# Patient Record
Sex: Female | Born: 1955 | Race: White | Hispanic: No | Marital: Married | Smoking: Never smoker
Health system: Southern US, Community
[De-identification: ages and names within clinical notes are randomized; demographics above are authoritative.]

## PROBLEM LIST (undated history)

## (undated) DIAGNOSIS — Z973 Presence of spectacles and contact lenses: Secondary | ICD-10-CM

## (undated) DIAGNOSIS — H409 Unspecified glaucoma: Secondary | ICD-10-CM

## (undated) DIAGNOSIS — Z8711 Personal history of peptic ulcer disease: Secondary | ICD-10-CM

## (undated) DIAGNOSIS — F32A Depression, unspecified: Secondary | ICD-10-CM

## (undated) DIAGNOSIS — J309 Allergic rhinitis, unspecified: Secondary | ICD-10-CM

## (undated) DIAGNOSIS — R1900 Intra-abdominal and pelvic swelling, mass and lump, unspecified site: Secondary | ICD-10-CM

## (undated) DIAGNOSIS — M1990 Unspecified osteoarthritis, unspecified site: Secondary | ICD-10-CM

## (undated) DIAGNOSIS — F329 Major depressive disorder, single episode, unspecified: Secondary | ICD-10-CM

## (undated) HISTORY — PX: CARPAL TUNNEL RELEASE: SHX101

## (undated) HISTORY — PX: HERNIA REPAIR: SHX51

## (undated) HISTORY — PX: EYE SURGERY: SHX253

## (undated) HISTORY — PX: OTHER SURGICAL HISTORY: SHX169

---

## 2001-11-07 ENCOUNTER — Encounter: Payer: Self-pay | Admitting: Obstetrics and Gynecology

## 2001-11-07 ENCOUNTER — Ambulatory Visit (HOSPITAL_COMMUNITY): Admission: RE | Admit: 2001-11-07 | Discharge: 2001-11-07 | Payer: Self-pay | Admitting: Obstetrics and Gynecology

## 2002-02-12 ENCOUNTER — Inpatient Hospital Stay (HOSPITAL_COMMUNITY): Admission: RE | Admit: 2002-02-12 | Discharge: 2002-02-14 | Payer: Self-pay | Admitting: Obstetrics and Gynecology

## 2002-02-12 HISTORY — PX: VAGINAL HYSTERECTOMY: SHX2639

## 2003-02-19 ENCOUNTER — Ambulatory Visit (HOSPITAL_COMMUNITY): Admission: RE | Admit: 2003-02-19 | Discharge: 2003-02-19 | Payer: Self-pay | Admitting: Obstetrics and Gynecology

## 2003-02-19 ENCOUNTER — Encounter: Payer: Self-pay | Admitting: Obstetrics and Gynecology

## 2003-12-26 ENCOUNTER — Encounter: Admission: RE | Admit: 2003-12-26 | Discharge: 2003-12-26 | Payer: Self-pay | Admitting: Family Medicine

## 2004-03-05 ENCOUNTER — Ambulatory Visit (HOSPITAL_COMMUNITY): Admission: RE | Admit: 2004-03-05 | Discharge: 2004-03-05 | Payer: Self-pay | Admitting: Obstetrics and Gynecology

## 2005-07-02 ENCOUNTER — Ambulatory Visit (HOSPITAL_COMMUNITY): Admission: RE | Admit: 2005-07-02 | Discharge: 2005-07-02 | Payer: Self-pay | Admitting: Obstetrics and Gynecology

## 2005-08-02 HISTORY — PX: CARPAL TUNNEL RELEASE: SHX101

## 2006-04-28 ENCOUNTER — Ambulatory Visit (HOSPITAL_COMMUNITY): Admission: RE | Admit: 2006-04-28 | Discharge: 2006-04-29 | Payer: Self-pay | Admitting: Urology

## 2006-04-28 HISTORY — PX: CYSTOCELE REPAIR: SHX163

## 2007-08-08 ENCOUNTER — Ambulatory Visit (HOSPITAL_COMMUNITY): Admission: RE | Admit: 2007-08-08 | Discharge: 2007-08-08 | Payer: Self-pay | Admitting: Family Medicine

## 2009-06-10 ENCOUNTER — Ambulatory Visit (HOSPITAL_COMMUNITY): Admission: RE | Admit: 2009-06-10 | Discharge: 2009-06-10 | Payer: Self-pay | Admitting: Family Medicine

## 2010-12-18 NOTE — Op Note (Signed)
NAME:  [name], [name]                  ACCOUNT NO.:  [id number]   MEDICAL RECORD NO.:  [id number]          PATIENT TYPE:  AMB   LOCATION:  DAY                          FACILITY:  [facility]   PHYSICIAN:  Excell Seltzer. Annabell Howells, M.D.    DATE OF BIRTH:  02/04/1956   DATE OF PROCEDURE:  04/28/2006  DATE OF DISCHARGE:                                 OPERATIVE REPORT   PROCEDURE:  Transvaginal paravaginal cystocele repair with graft.   PREOPERATIVE DIAGNOSIS:  Recurrent cystocele.   POSTOPERATIVE DIAGNOSIS:  Recurrent cystocele.   SURGEON:  Excell Seltzer. Annabell Howells, M.D.   ASSISTANT:  Martina Sinner, MD.   ANESTHESIA:  General.   SPECIMEN:  None.   BLOOD LOSS:  100 cc.   DRAINS:  Foley catheter and vaginal pack.   COMPLICATIONS:  None.   INDICATIONS:  [name] is a 55 year old white female with history of  anterior-posterior paravaginal hysterectomy and tension-free vaginal taping  in July 2003.  She now has a recurrent cystocele that protrudes the  introitus.  She has no rectocele; her vault is reasonably well-supported at  the apex and she does have some stress incontinence with urgency -- only  with a full bladder.  Urodynamics revealed a hypotonic bladder.  It was felt  that cystocele repair only was indicated.   FINDINGS AND PROCEDURE:  The patient was given Cipro.  She was taken to the  operating room, where general anesthetic was induced.  She was placed in the  lithotomy position.  She was shaved.  She was prepped with Betadine solution  and draped in the usual sterile fashion.  A Foley catheter was inserted.  The bladder was drained.  A weighted vaginal retractor was placed.  The  anterior vaginal wall was infiltrated  with 20 cc of 1% lidocaine with  epinephrine.  An anterior vaginal incision was made from the bladder neck  level back to her vaginal cuff.  The vaginal mucosa was then elevated off  the pubovesical fascia, out to the pelvic sidewall on both sides.  At this  point the  cystocele was reduced, using a running 2-0 Vicryl suture in 2  layers.   Cystoscopy was then performed, after the patient had been given indigo  carmine.  Blue urine was noted.  The efflux from a single left ureteral  orifice and duplicate right ureteral orifices.   The Foley catheter was reinserted; the bladder was drained.  A Xenform 6 x  10 cm graft was prepared and brought onto the field.  then 0 Ethibond  sutures were placed at the proximal limb of the cystocele, into the pelvic  sidewall bilaterally, and at the distal limit of the cystocele on into the  pelvic sidewall.  Once these stitches were positioned, the graft was trimmed  to an appropriate size and secured using the preplaced Ethibond stitches.  Once the graft had been sewn down, the graft was tacked at the cuff  using a  3-0 Vicryl, and at the bladder neck using 3-0 Vicryl.  The anterior vaginal  wall mucosa was then trimmed  appropriately and closed using a running 2-0  Vicryl suture.  A 2 inch vaginal pack was then placed.  The Foley was placed  to straight drainage.  The patient was taken down from the lithotomy  position.  Her anesthetic was reversed.  She was moved to the recovery room  in stable condition.  There were no complications.      Excell Seltzer. Annabell Howells, M.D.  Electronically Signed     JJW/MEDQ  D:  04/28/2006  T:  04/30/2006  Job:  [id number]   cc:   Duwayne Heck L. Mahaffey, M.D.  Fax: [phone]

## 2010-12-18 NOTE — Op Note (Signed)
[facility]  Patient:    [name], [name] Visit Number: [id number] MRN: [id number]          Service Type: MED Location: 4A A416 01 Attending Physician:  Tilda Burrow Dictated by:   Dennie Maizes, M.D. Proc. Date: 02/12/02 Admit Date:  02/12/2002   CC:         Christin Bach, M.D.   Operative Report  PREOPERATIVE DIAGNOSIS:  Uterine prolapse, pelvic relaxation, stress urinary incontinence, umbilical hernia.  POSTOPERATIVE DIAGNOSIS:  Uterine prolapse, pelvic relaxation, stress urinary incontinence, umbilical hernia.  OPERATIVE PROCEDURE:  Tension-free transvaginal ______ urethral sling procedure using the Uretex system.  ANESTHESIA:  General.  SURGEON:  Dennie Maizes, M.D.  ASSISTANT:  Christin Bach, M.D.  ESTIMATED BLOOD LOSS:  Minimal.  COMPLICATIONS:  None.  DRAINS:  A 20 French Foley catheter in the bladder.  INDICATIONS FOR PROCEDURE:  This 55 year old female had troublesome stress urinary incontinence associated with pelvic relaxation and umbilical hernia. She is scheduled to undergo vaginal hysterectomy, anterior and posterior repair, and umbilical hernia repair by Dr. Emelda Fear.  I plan to do tension-free transvaginal ______ procedure under the same anesthesia.  DESCRIPTION OF PROCEDURE:  General anesthesia was induced and the patient was placed on the OR table in the high lithotomy position.  Dr. Emelda Fear first did the umbilical hernia repair.  The abdomen and genitalia were reprepped and draped in a sterile fashion.  He proceeded with a vaginal hysterectomy and mobilized the bladder.  Anterior ______ done next.  I then proceeded with TVT procedure.  A 20 French Foley catheter was insufflated into the bladder and clear urine was drained.  The pubic tubercles as well as the two lateral points, about 1.5 cm above and lateral to the pubic tubercles were marked on the skin.  About 10 cc of 0.25% Marcaine was injected along the side  of the bladder for hydrodissection.  The urethra was then held with two Allis forceps.  A 1 cm sized incision was then made in the mid urethra.  Mucosal flaps were raised on both sides along the side of the urethra.  The trocar carrying the blue guide tube was inserted on the right side with digital guidance.  The trocar was inserted just behind the pubic ramus to exit through the previously marked point on the suprapubic area. Cystoscopy was done and there was no evidence of any bladder entry.  The blue guide tube was then pulled through the suprapubic incision.  A similar procedure was done on the left side using digital guidance.  Cystoscopy confirmed proper positioning of the trocar.  The trocar was pulled out pulling the guide tube through the suprapubic incision.  Prolene tape was then attached to the guide tubes on both sides.  Prolene tape was pushed through suprapubic incisions on both sides.  The bladder was then filled with 300 cc of water.  Suprapubic compression was done and the tension of the tape was adjusted to prevent leakage of urine.  The plastic sheath covering the Prolene tape was then removed.  Forceps could be inserted between the urethra and the Prolene tape without any difficulty.  The Prolene tape was then trimmed with the ______ subcutaneous tissues.  The suprapubic incisions were closed using 3-0 Vicryl subcuticular suture.  Urethral incision was then closed using 3-0 chromic gut.  A 20 French Foley catheter was inserted into the bladder. Dr. Emelda Fear then proceeded with the posterior repair.  Patient remained stable throughout the procedure.  She was  transferred to the PACU in a satisfactory condition.  The sponge and instrument counts were correct at the time of closure. Dictated by:   Dennie Maizes, M.D. Attending Physician:  Tilda Burrow DD:  02/12/02 TD:  02/13/02 Job: [id number]

## 2010-12-18 NOTE — Op Note (Signed)
[name], [name]                              ACCOUNT NO.:  [id number]   MEDICAL RECORD NO.:  [id number]                   PATIENT TYPE:  INP   LOCATION:  A416                                 FACILITY:  [facility]   PHYSICIAN:  Tilda Burrow, M.D.              DATE OF BIRTH:  02/04/1956   DATE OF PROCEDURE:  02/12/2002  DATE OF DISCHARGE:  02/14/2002                                 OPERATIVE REPORT   PREOPERATIVE DIAGNOSES:  1. Cystocele.  2. Rectocele.  3. First degree uterine descensus.  4. Stress incontinence.   POSTOPERATIVE DIAGNOSES:  1. Cystocele.  2. Rectocele.  3. First degree uterine descensus.  4. Stress incontinence.   PROCEDURE:  1. Vaginal hysterectomy and anterior repair by Tilda Burrow, M.D.  2. Tension-free vaginal taping and cystoscopy by Dennie Maizes, M.D.     dictated elsewhere.   SURGEON:  Tilda Burrow, M.D.   ASSISTANT:  Dennie Maizes, M.D.   ANESTHESIA:  General   COMPLICATIONS:  None.   ESTIMATED BLOOD LOSS:  500 cc.   FINDINGS:  Lax tissues throughout the pelvis.   DETAILS OF PROCEDURE:  The patient was taken to the operating room and  prepped and draped in the usual fashion for combined abdominal procedure  with legs supported in leg supports in good position in the high lithotomy  position.  The cervix was grasped with a single-tooth tenaculum, elevated  and the posterior colpotomy incision performed.  A short weighted speculum  was placed in the vagina.  The peritoneal cavity was entered posteriorly.  The uterosacral ligaments were taken down on either side using curved  Zeppelin clamps, Mayo scissors transection and #0 chromic suture ligature.   At this time the anterior cervical vaginal fornix was opened with a knife  incision, sharply elevated to identify the preperitoneal vesicouterine  reflection and peritoneum, then cardinal ligaments taken down on either side  using Zeplin clamps, Mayo scissor transection and #0 chromic  suture  ligature.  We then marched up each side of the uterus using Z-clamps and  sharp dissection with #0 chromic suture.  The peritoneum was entered once  the upper cardinal ligaments were then taken down and then we marched up  each side of the broad ligament and were taking serial bites to remove the  uterus.  Adnexa were inspected and they were grossly normal and left in  situ.  The peritoneal cavity was then closed using purse-string suture of 2-  0 chromic and then the cuff closed in the midline using interrupted 2-0  chromic.   ANTERIOR REPAIR:  Anterior repair was then started by splitting the anterior  vaginal mucosa from the cuff inferiorly, along the anterior vaginal wall  peeling away the vaginal mucosa from the underlying bladder and identifying  good connective tissue on either side which allowed placement of interrupted  #0 Dexon pop off sutures  that should be tabbed down.  The bladder flap was  elevated and the peritoneal edges were reapproximated.   See Dr. Chancy Milroy note regarding the tension-free vaginal taping and  cystoscopy.   POSTERIOR REPAIR:  Posterior repair was then performed by opening the  posterior perineal body at the hymen remnants, dissecting the vaginal mucosa  off the underlying connective tissue, placing a finger in the rectum, doubly  gloved, and identifying good lateral connective support tissues that could  be pulled over the rectum to rebuild the perineal body.  This was performed  successfully, with interrupted sutures of #0 Dexon used. The redundant  vaginal mucosa was trimmed posteriorly and reapproximated using interrupted  2-0 chromic and the procedure considered successfully completed.                                               Tilda Burrow, M.D.    JVF/MEDQ  D:  04/07/2002  T:  04/07/2002  Job:  [id number]

## 2010-12-18 NOTE — H&P (Signed)
[facility]  Patient:    [name] Visit Number: [id number] MRN: [id number]          Service Type: Attending:  Dennie Maizes, M.D. Dictated by:   Dennie Maizes, M.D. Adm. Date:  02/12/02   CC:         Christin Bach, M.D.   History and Physical  CHIEF COMPLAINT:  Loss of urine during coughing and sneezing.  HISTORY OF PRESENT ILLNESS:  This 55 year old female has been referred to me by Dr. Emelda Fear for evaluation and management of stress urinary incontinence. The patient had a fourth degree perineal laceration with her first childbirth. She has had stress urinary incontinence since that time.  She had an episode of pneumonia three months ago, and her urinary leakage has been getting worse since that time.  She has loss of urine during coughing and sneezing.  She denies having any urinary urgency or urge incontinence.  She has urinary frequency x6 and nocturia x0.  She has to wear pads for control of the urinary leakage.  She denied having any fever, chills, voiding difficulty, gross hematuria, or dysuria or urinary tract infections.  There is no past history of urolithiasis.  She was evaluated by Dr. Emelda Fear and informed of first degree uterine descensus associated with cystocele and rectocele.  She also has irregular and excessive menstrual periods.  Dr. Emelda Fear plans to do a vaginal hysterectomy with anterior and posterior repair.  After evaluation, she has been scheduled to undergo TVT procedure under the same anesthesia.  PAST MEDICAL HISTORY:  History of depression and plantar fasciitis.  MEDICATIONS:  Prozac and Zyrtec.  ALLERGIES:  TETRACYCLINE and ERYTHROMYCIN.  PERSONAL HISTORY:  The patient is married, and she has two children.  FAMILY HISTORY:  Positive for hypertension, diabetes mellitus, carcinoma of the breast, coronary artery disease, and CVA.  PHYSICAL EXAMINATION:  VITAL SIGNS:  Height 5 feet 4 inches, weight 200  pounds.  HEENT:  Normal.  NECK:  No masses.  CHEST:  Lungs clear to auscultation.  CARDIAC:  Regular rate and rhythm with no murmurs.  ABDOMEN:  Soft, no palpable flank mass, no costovertebral angle tenderness. Bladder is not palpable.  PELVIC:  First degree uterine prolapse with associated cystocele and rectocele.  DIAGNOSTIC EVALUATION:  The patient underwent further evaluation in the office.  Catheterization revealed a postvoid residual of only 10 ml.  CMG revealed normal bladder sensations, and the patient could feel the filling of the bladder at a volume of 75 ml.  She developed a desire to void at the volume of 440 ml and a bladder capacity of 580 ml.  Leak point pressure was more than 100 cmH2O.  The patient had moderate degree of stress urinary incontinence with coughing, and Marshalls test was positive.  Urethral hypermobility was noted.  Cystoscopy was done under local anesthesia. Appearance of the bladder was normal.  The trigone, ureteral orifices, and bladder mucosa were unremarkable.  No abnormality was noted inside the bladder.  IMPRESSION:  Stress urinary incontinence, pelvic relaxation.  PLAN:  The patient is scheduled to undergo vaginal hysterectomy with anterior and posterior repair by Dr. Emelda Fear.  I explained to her about doing a _____ transvaginal tape ureteral sling procedure under the same anesthesia, and she was agreeable.  Discussed with her regarding the diagnosis, operative details, alternate treatments, all possible risks and complications.  I have informed her about complications like bladder perforation, ureteral injury, vascular injury as well as injury to the bowels.  Postoperative  urinary retention and persistent urinary incontinence were discussed with the patient.  She is scheduled to undergo the surgery on February 12, 2002, at [facility]. Dictated by:   Dennie Maizes, M.D. Attending:  Dennie Maizes, M.D. DD:  02/10/02 TD:   02/10/02 Job: [id number]

## 2010-12-18 NOTE — Op Note (Signed)
   NAME:  [name], [name]                              ACCOUNT NO.:  [id number]   MEDICAL RECORD NO.:  [id number]                   PATIENT TYPE:  INP   LOCATION:  A416                                 FACILITY:  [facility]   PHYSICIAN:  Tilda Burrow, M.D.              DATE OF BIRTH:  02/04/1956   DATE OF PROCEDURE:  DATE OF DISCHARGE:  02/14/2002                                 OPERATIVE REPORT   ADDENDUM:  In addition to the previously mentioned surgery, the patient had  an umbilical herniorrhaphy which was performed prior to the vaginal  procedure.  This was performed by prepping and draping the umbilicus,  umbilical area, performing a semicircular 3 cm incision, dissecting down to  identify the hernia which was entered, opened, and the large amount of  omental fat removed, and tied off so that the nonreducible hernia sac  contents could then be dropped back into the abdomen.  We then closed the  fascial defect with interrupted permanent nylon suture and then irrigated,  confirmed hemostasis and reapproximated skin edges with subcuticular Dexon  suture.  The patient tolerated the procedure well.                                               Tilda Burrow, M.D.    JVF/MEDQ  D:  04/07/2002  T:  04/07/2002  Job:  [id number]

## 2010-12-18 NOTE — Discharge Summary (Signed)
   NAMEMARGUERITA, [name]                              ACCOUNT NO.:  [id number]   MEDICAL RECORD NO.:  [id number]                   PATIENT TYPE:  INP   LOCATION:  A416                                 FACILITY:  [facility]   PHYSICIAN:  Tilda Burrow, M.D.              DATE OF BIRTH:  02/04/1956   DATE OF ADMISSION:  02/12/2002  DATE OF DISCHARGE:  02/14/2002                                 DISCHARGE SUMMARY   Rectocele.   SURGEON:  Tilda Burrow, M.D.   ASSISTANT:  None.   ANESTHESIA:  General.   COMPLICATIONS:  None.   FINDINGS:  Dictation ended at this point.                                                 Tilda Burrow, M.D.    JVF/MEDQ  D:  04/07/2002  T:  04/08/2002  Job:  [id number]

## 2010-12-18 NOTE — Discharge Summary (Signed)
   [name], [name]                              ACCOUNT NO.:  [id number]   MEDICAL RECORD NO.:  [id number]                   PATIENT TYPE:  INP   LOCATION:  A416                                 FACILITY:  [facility]   PHYSICIAN:  Tilda Burrow, M.D.              DATE OF BIRTH:  02/04/1956   DATE OF ADMISSION:  02/12/2002  DATE OF DISCHARGE:  02/14/2002                                 DISCHARGE SUMMARY   ADMISSION DIAGNOSES:  1. Stress incontinence, cystocele, rectocele status post fourth degree     laceration, first degree uterine descensus.  2. A 2 cm umbilical hernia.   DISCHARGE DIAGNOSES:  1. Stress incontinence, cystocele, rectocele status post fourth degree     laceration, first degree uterine descensus.  2. A 2 cm umbilical hernia.   PROCEDURE:  Vaginal hysterectomy and anterior repair, tension-free taping,  umbilical herniorrhaphy.   DISCHARGE MEDICATIONS:  1. Tylox one q.4h. p.r.n. pain.  2. Prozac 20 mg p.o. q.d.  3. Cipro 500 mg b.i.d. x1 week.  4. Laxative of choice p.r.n.  5. Motrin 200 mg two q.4h. p.r.n. pain.   HISTORY OF PRESENT ILLNESS:  This 55 year old female was admitted for  symptomatic cystocele with associated stress incontinence and posterior  relaxation status post fourth degree laceration with first infant.  First  degree uterine descensus is also present.  The patient additionally has a 2  cm umbilical hernia which will be repaired.   HOSPITAL COURSE:  The patient was admitted, underwent vaginal hysterectomy  and A&P repair with TVT in addition to an umbilical hernia, described in the  operative notes.  Dr. Rito Ehrlich performed the TVT and cystoscopy and I  performed the vaginal hysterectomy and A&P repair as well as the umbilical  herniorrhaphy.   The patient's postoperative course was quite straightforward.  She had  postoperative hemoglobin 10 and hematocrit 28.8, down from 12 and 35  preoperatively.  She was afebrile, tolerating a regular diet.   Stable for  discharge on postoperative day #2 with the patient discharged home with one  week's worth of antibiotics Cipro 500 b.i.d. x1 week, as well as Tylox for  pain and laxative of choice p.r.n.  She will be followed up in one week.                                               Tilda Burrow, M.D.    JVF/MEDQ  D:  04/07/2002  T:  04/09/2002  Job:  [id number]

## 2012-01-20 ENCOUNTER — Other Ambulatory Visit (HOSPITAL_COMMUNITY): Payer: Self-pay | Admitting: Family Medicine

## 2012-01-20 ENCOUNTER — Other Ambulatory Visit: Payer: Self-pay | Admitting: *Deleted

## 2012-01-20 DIAGNOSIS — Z139 Encounter for screening, unspecified: Secondary | ICD-10-CM

## 2012-02-15 ENCOUNTER — Ambulatory Visit (HOSPITAL_COMMUNITY)
Admission: RE | Admit: 2012-02-15 | Discharge: 2012-02-15 | Disposition: A | Discharge status: Home / Self Care | Payer: Federal, State, Local not specified - PPO | Source: Ambulatory Visit | Attending: Family Medicine | Admitting: Family Medicine

## 2012-02-15 DIAGNOSIS — Z1231 Encounter for screening mammogram for malignant neoplasm of breast: Secondary | ICD-10-CM | POA: Insufficient documentation

## 2012-02-15 DIAGNOSIS — Z139 Encounter for screening, unspecified: Secondary | ICD-10-CM

## 2015-06-04 ENCOUNTER — Other Ambulatory Visit (HOSPITAL_COMMUNITY): Payer: Self-pay | Admitting: Family Medicine

## 2015-06-04 DIAGNOSIS — Z1231 Encounter for screening mammogram for malignant neoplasm of breast: Secondary | ICD-10-CM

## 2015-06-12 ENCOUNTER — Ambulatory Visit (HOSPITAL_COMMUNITY): Payer: Federal, State, Local not specified - PPO

## 2015-07-10 ENCOUNTER — Ambulatory Visit (HOSPITAL_COMMUNITY): Payer: Federal, State, Local not specified - PPO

## 2015-07-10 ENCOUNTER — Ambulatory Visit (HOSPITAL_COMMUNITY)
Admission: RE | Admit: 2015-07-10 | Discharge: 2015-07-10 | Disposition: A | Discharge status: Home / Self Care | Payer: Federal, State, Local not specified - PPO | Source: Ambulatory Visit | Attending: Family Medicine | Admitting: Family Medicine

## 2015-07-10 DIAGNOSIS — Z1231 Encounter for screening mammogram for malignant neoplasm of breast: Secondary | ICD-10-CM | POA: Insufficient documentation

## 2017-06-10 ENCOUNTER — Other Ambulatory Visit: Payer: Self-pay | Admitting: Physician Assistant

## 2017-06-10 DIAGNOSIS — R102 Pelvic and perineal pain: Secondary | ICD-10-CM

## 2017-06-17 ENCOUNTER — Ambulatory Visit
Admission: RE | Admit: 2017-06-17 | Discharge: 2017-06-17 | Disposition: A | Discharge status: Home / Self Care | Payer: Federal, State, Local not specified - PPO | Source: Ambulatory Visit | Attending: Physician Assistant | Admitting: Physician Assistant

## 2017-06-17 DIAGNOSIS — R102 Pelvic and perineal pain: Secondary | ICD-10-CM

## 2017-10-31 ENCOUNTER — Observation Stay (HOSPITAL_COMMUNITY): Payer: Federal, State, Local not specified - PPO

## 2017-10-31 ENCOUNTER — Observation Stay (HOSPITAL_COMMUNITY)
Admission: EM | Admit: 2017-10-31 | Discharge: 2017-11-03 | Disposition: A | Discharge status: Home / Self Care | Payer: Federal, State, Local not specified - PPO | Attending: Internal Medicine | Admitting: Internal Medicine

## 2017-10-31 ENCOUNTER — Encounter (HOSPITAL_COMMUNITY): Payer: Self-pay

## 2017-10-31 ENCOUNTER — Other Ambulatory Visit: Payer: Self-pay

## 2017-10-31 DIAGNOSIS — F329 Major depressive disorder, single episode, unspecified: Secondary | ICD-10-CM | POA: Diagnosis not present

## 2017-10-31 DIAGNOSIS — E876 Hypokalemia: Secondary | ICD-10-CM | POA: Diagnosis not present

## 2017-10-31 DIAGNOSIS — R51 Headache: Secondary | ICD-10-CM | POA: Insufficient documentation

## 2017-10-31 DIAGNOSIS — D72829 Elevated white blood cell count, unspecified: Secondary | ICD-10-CM | POA: Diagnosis not present

## 2017-10-31 DIAGNOSIS — F17200 Nicotine dependence, unspecified, uncomplicated: Secondary | ICD-10-CM | POA: Diagnosis not present

## 2017-10-31 DIAGNOSIS — R1012 Left upper quadrant pain: Secondary | ICD-10-CM | POA: Insufficient documentation

## 2017-10-31 DIAGNOSIS — H409 Unspecified glaucoma: Secondary | ICD-10-CM | POA: Diagnosis not present

## 2017-10-31 DIAGNOSIS — Z881 Allergy status to other antibiotic agents status: Secondary | ICD-10-CM | POA: Insufficient documentation

## 2017-10-31 DIAGNOSIS — E860 Dehydration: Secondary | ICD-10-CM | POA: Diagnosis not present

## 2017-10-31 DIAGNOSIS — J019 Acute sinusitis, unspecified: Secondary | ICD-10-CM | POA: Diagnosis not present

## 2017-10-31 DIAGNOSIS — R112 Nausea with vomiting, unspecified: Principal | ICD-10-CM | POA: Insufficient documentation

## 2017-10-31 DIAGNOSIS — F32A Depression, unspecified: Secondary | ICD-10-CM | POA: Diagnosis present

## 2017-10-31 DIAGNOSIS — E871 Hypo-osmolality and hyponatremia: Secondary | ICD-10-CM | POA: Diagnosis not present

## 2017-10-31 DIAGNOSIS — K5900 Constipation, unspecified: Secondary | ICD-10-CM | POA: Diagnosis not present

## 2017-10-31 DIAGNOSIS — Z8249 Family history of ischemic heart disease and other diseases of the circulatory system: Secondary | ICD-10-CM | POA: Insufficient documentation

## 2017-10-31 DIAGNOSIS — Z79899 Other long term (current) drug therapy: Secondary | ICD-10-CM | POA: Diagnosis not present

## 2017-10-31 DIAGNOSIS — R519 Headache, unspecified: Secondary | ICD-10-CM | POA: Diagnosis present

## 2017-10-31 HISTORY — DX: Major depressive disorder, single episode, unspecified: F32.9

## 2017-10-31 HISTORY — DX: Unspecified glaucoma: H40.9

## 2017-10-31 HISTORY — DX: Depression, unspecified: F32.A

## 2017-10-31 LAB — COMPREHENSIVE METABOLIC PANEL
ALT: 29 U/L (ref 14–54)
ANION GAP: 9 (ref 5–15)
AST: 23 U/L (ref 15–41)
Albumin: 3.5 g/dL (ref 3.5–5.0)
Alkaline Phosphatase: 95 U/L (ref 38–126)
BILIRUBIN TOTAL: 0.6 mg/dL (ref 0.3–1.2)
BUN: 11 mg/dL (ref 6–20)
CHLORIDE: 94 mmol/L — AB (ref 101–111)
CO2: 26 mmol/L (ref 22–32)
Calcium: 8.5 mg/dL — ABNORMAL LOW (ref 8.9–10.3)
Creatinine, Ser: 0.5 mg/dL (ref 0.44–1.00)
GFR calc Af Amer: >60 mL/min (ref >60)
Glucose, Bld: 114 mg/dL — ABNORMAL HIGH (ref 65–99)
POTASSIUM: 3.5 mmol/L (ref 3.5–5.1)
Sodium: 129 mmol/L — ABNORMAL LOW (ref 135–145)
TOTAL PROTEIN: 6.8 g/dL (ref 6.5–8.1)

## 2017-10-31 LAB — CBC
HEMATOCRIT: 38.2 % (ref 36.0–46.0)
HEMOGLOBIN: 12.6 g/dL (ref 12.0–15.0)
MCH: 27 pg (ref 26.0–34.0)
MCHC: 33 g/dL (ref 30.0–36.0)
MCV: 81.8 fL (ref 78.0–100.0)
Platelets: 364 10*3/uL (ref 150–400)
RBC: 4.67 MIL/uL (ref 3.87–5.11)
RDW: 13.4 % (ref 11.5–15.5)
WBC: 16.5 10*3/uL — AB (ref 4.0–10.5)

## 2017-10-31 LAB — URINALYSIS, ROUTINE W REFLEX MICROSCOPIC
Bilirubin Urine: NEGATIVE
GLUCOSE, UA: NEGATIVE mg/dL
Hgb urine dipstick: NEGATIVE
KETONES UR: 5 mg/dL — AB
LEUKOCYTES UA: NEGATIVE
NITRITE: NEGATIVE
PH: 8 (ref 5.0–8.0)
Protein, ur: NEGATIVE mg/dL
SPECIFIC GRAVITY, URINE: 1.019 (ref 1.005–1.030)

## 2017-10-31 LAB — LIPASE, BLOOD: LIPASE: 21 U/L (ref 11–51)

## 2017-10-31 MED ORDER — ENOXAPARIN SODIUM 40 MG/0.4ML ~~LOC~~ SOLN
40.0000 mg | SUBCUTANEOUS | Status: DC
Start: 2017-10-31 — End: 2017-11-03
  Administered 2017-10-31 – 2017-11-02 (×3): 40 mg via SUBCUTANEOUS
  Filled 2017-10-31 (×3): qty 0.4

## 2017-10-31 MED ORDER — SODIUM CHLORIDE 0.9 % IV BOLUS
1000.0000 mL | Freq: Once | INTRAVENOUS | Status: AC
  Administered 2017-10-31: 1000 mL via INTRAVENOUS

## 2017-10-31 MED ORDER — ONDANSETRON HCL 4 MG/2ML IJ SOLN
4.0000 mg | Freq: Once | INTRAMUSCULAR | Status: AC
  Administered 2017-10-31: 4 mg via INTRAVENOUS
  Filled 2017-10-31: qty 2

## 2017-10-31 MED ORDER — KETOROLAC TROMETHAMINE 15 MG/ML IJ SOLN
15.0000 mg | Freq: Once | INTRAMUSCULAR | Status: AC
  Administered 2017-10-31: 15 mg via INTRAVENOUS
  Filled 2017-10-31: qty 1

## 2017-10-31 MED ORDER — ACETAMINOPHEN 500 MG PO TABS
1000.0000 mg | ORAL_TABLET | Freq: Once | ORAL | Status: AC
  Administered 2017-10-31: 1000 mg via ORAL
  Filled 2017-10-31: qty 2

## 2017-10-31 MED ORDER — SODIUM CHLORIDE 0.9 % IV SOLN
INTRAVENOUS | Status: DC
  Administered 2017-10-31 – 2017-11-03 (×6): via INTRAVENOUS

## 2017-10-31 MED ORDER — HYDROCOD POLST-CPM POLST ER 10-8 MG/5ML PO SUER
5.0000 mL | Freq: Two times a day (BID) | ORAL | Status: DC | PRN
  Administered 2017-11-01 – 2017-11-02 (×2): 5 mL via ORAL
  Filled 2017-10-31 (×2): qty 5

## 2017-10-31 MED ORDER — ACETAMINOPHEN 500 MG PO TABS: 1000.0000 mg | ORAL_TABLET | Freq: Once | ORAL | Status: DC

## 2017-10-31 MED ORDER — ONDANSETRON HCL 4 MG PO TABS: 4.0000 mg | ORAL_TABLET | Freq: Four times a day (QID) | ORAL | Status: DC | PRN

## 2017-10-31 MED ORDER — VITAMIN B-12 1000 MCG PO TABS
1000.0000 ug | ORAL_TABLET | Freq: Every day | ORAL | Status: DC
  Administered 2017-11-01 – 2017-11-03 (×3): 1000 ug via ORAL
  Filled 2017-10-31 (×3): qty 1

## 2017-10-31 MED ORDER — ACETAMINOPHEN 325 MG PO TABS
650.0000 mg | ORAL_TABLET | Freq: Four times a day (QID) | ORAL | Status: DC | PRN
  Administered 2017-11-01: 650 mg via ORAL
  Filled 2017-10-31: qty 2

## 2017-10-31 MED ORDER — OCUVITE-LUTEIN PO CAPS
1.0000 | ORAL_CAPSULE | Freq: Every day | ORAL | Status: DC
  Filled 2017-10-31: qty 1

## 2017-10-31 MED ORDER — SODIUM CHLORIDE 0.9 % IV SOLN
Freq: Once | INTRAVENOUS | Status: AC
  Administered 2017-10-31: 20:00:00 via INTRAVENOUS

## 2017-10-31 MED ORDER — FLUOXETINE HCL 20 MG PO CAPS
20.0000 mg | ORAL_CAPSULE | Freq: Every day | ORAL | Status: DC
  Administered 2017-11-01 – 2017-11-03 (×3): 20 mg via ORAL
  Filled 2017-10-31 (×3): qty 1

## 2017-10-31 MED ORDER — PROMETHAZINE HCL 25 MG/ML IJ SOLN
12.5000 mg | Freq: Four times a day (QID) | INTRAMUSCULAR | Status: DC | PRN
  Administered 2017-11-01 (×2): 12.5 mg via INTRAVENOUS
  Filled 2017-10-31 (×2): qty 1

## 2017-10-31 MED ORDER — BACLOFEN 10 MG PO TABS
20.0000 mg | ORAL_TABLET | Freq: Every day | ORAL | Status: DC
  Administered 2017-10-31 – 2017-11-03 (×4): 20 mg via ORAL
  Filled 2017-10-31 (×5): qty 2

## 2017-10-31 MED ORDER — ACETAMINOPHEN 650 MG RE SUPP: 650.0000 mg | Freq: Four times a day (QID) | RECTAL | Status: DC | PRN

## 2017-10-31 MED ORDER — ONDANSETRON HCL 4 MG/2ML IJ SOLN
4.0000 mg | Freq: Four times a day (QID) | INTRAMUSCULAR | Status: DC | PRN
  Administered 2017-11-01: 4 mg via INTRAVENOUS
  Filled 2017-10-31: qty 2

## 2017-10-31 MED ORDER — BRIMONIDINE TARTRATE 0.15 % OP SOLN
1.0000 [drp] | Freq: Every day | OPHTHALMIC | Status: DC
  Administered 2017-10-31 – 2017-11-03 (×4): 1 [drp] via OPHTHALMIC
  Filled 2017-10-31: qty 5

## 2017-10-31 MED ORDER — VITAMIN D3 25 MCG (1000 UNIT) PO TABS
1000.0000 [IU] | ORAL_TABLET | Freq: Every day | ORAL | Status: DC
  Administered 2017-11-01 – 2017-11-03 (×3): 1000 [IU] via ORAL
  Filled 2017-10-31 (×3): qty 1

## 2017-10-31 NOTE — ED Provider Notes (Signed)
[facility]-EMERGENCY DEPT Provider Note   CSN: [id number] Arrival date & time: 10/31/17  1236     History   Chief Complaint Chief Complaint  Patient presents with  . Headache  . Emesis  . Back Pain  . Abdominal Pain    HPI [name] is a 62 y.o. female.  62 year old female with prior history of depression and glaucoma presents with complaint of malaise, fatigue, sinus congestion, and frontal headache.  Patient reports that she has had symptoms for the last week.  She was started on Augmentin by her primary care provider 2 days ago.  After starting the Augmentin she started to have nausea and vomiting.  She presents now with these complaints.  She denies any associated fever.  She denies any focal weakness or speech change.  She reports that she is had minimal p.o. intake over the last 12-24 hours.  The history is provided by the patient.  Emesis   This is a new problem. The current episode started 12 to 24 hours ago. The problem occurs more than 10 times per day. The problem has not changed since onset.There has been no fever. Associated symptoms include URI.    Past Medical History:  Diagnosis Date  . Depression   . Glaucoma     There are no active problems to display for this patient.   Past Surgical History:  Procedure Laterality Date  . bladdder sling    . CARPAL TUNNEL RELEASE Right   . EYE SURGERY    . HERNIA REPAIR       OB History   None      Home Medications    Prior to Admission medications   Medication Sig Start Date End Date Taking? Authorizing Provider  ALPHAGAN P 0.1 % SOLN Place 1 drop into both eyes daily.  08/12/17  Yes [provider]  amoxicillin-clavulanate (AUGMENTIN) 875-125 MG tablet TK 1 T PO BID FOR 7 DAYS 10/30/17  Yes [provider]  baclofen (LIORESAL) 20 MG tablet Take 20 mg by mouth daily.  10/17/17  Yes [provider]  chlorpheniramine-HYDROcodone (TUSSIONEX) 10-8 MG/5ML SUER  SHAKE LQ AND TK 5 ML PO Q 12 H FOR UP TO 7 DAYS PRN PAIN 10/30/17  Yes [provider]  cholecalciferol (VITAMIN D) 1000 units tablet Take 1,000 Units by mouth daily.   Yes [provider]  FLUoxetine (PROZAC) 20 MG capsule Take 20 mg by mouth daily.  10/30/17  Yes [provider]  Multiple Vitamins-Minerals (PRESERVISION AREDS) CAPS Take 1 capsule by mouth daily.   Yes [provider]  ondansetron (ZOFRAN-ODT) 8 MG disintegrating tablet Take 8 mg by mouth every 8 (eight) hours as needed for nausea or vomiting.  10/31/17  Yes [provider]  vitamin B-12 (CYANOCOBALAMIN) 1000 MCG tablet Take 1,000 mcg by mouth daily.   Yes [provider]    Family History Family History  Problem Relation Age of Onset  . Heart failure Mother   . Glaucoma Mother   . Diabetes Mother   . Atrial fibrillation Mother     Social History Social History   Tobacco Use  . Smoking status: Current Every Day Smoker  . Smokeless tobacco: Current User  Substance Use Topics  . Alcohol use: Not Currently  . Drug use: Never     Allergies   Erythromycin and Tetracyclines & related   Review of Systems Review of Systems  Gastrointestinal: Positive for nausea and vomiting.  All  other systems reviewed and are negative.    Physical Exam Updated Vital Signs BP 124/72 (BP Location: Right Arm)   Pulse 72   Temp 97.7 F (36.5 C) (Oral)   Resp 18   Ht 5\' 4"  (1.626 m)   Wt 92.7 kg (204 lb 5 oz)   SpO2 96%   BMI 35.07 kg/m   Physical Exam  Constitutional: She is oriented to person, place, and time. She appears well-developed and well-nourished. No distress.  HENT:  Head: Normocephalic and atraumatic.  Mouth/Throat: Oropharynx is clear and moist.  Eyes: Pupils are equal, round, and reactive to light. Conjunctivae and EOM are normal.  Neck: Normal range of motion. Neck supple.  Cardiovascular: Normal rate, regular rhythm and normal heart sounds.    Pulmonary/Chest: Effort normal and breath sounds normal. No respiratory distress.  Abdominal: Soft. She exhibits no distension. There is no tenderness.  Musculoskeletal: Normal range of motion. She exhibits no edema or deformity.  Neurological: She is alert and oriented to person, place, and time.  Skin: Skin is warm and dry.  Psychiatric: She has a normal mood and affect.  Nursing note and vitals reviewed.    ED Treatments / Results  Labs (all labs ordered are listed, but only abnormal results are displayed) Labs Reviewed  COMPREHENSIVE METABOLIC PANEL - Abnormal; Notable for the following components:      Result Value   Sodium 129 (*)    Chloride 94 (*)    Glucose, Bld 114 (*)    Calcium 8.5 (*)    All other components within normal limits  CBC - Abnormal; Notable for the following components:   WBC 16.5 (*)    All other components within normal limits  URINALYSIS, ROUTINE W REFLEX MICROSCOPIC - Abnormal; Notable for the following components:   APPearance HAZY (*)    Ketones, ur 5 (*)    All other components within normal limits  LIPASE, BLOOD    EKG None  Radiology No results found.  Procedures Procedures (including critical care time)  Medications Ordered in ED Medications  sodium chloride 0.9 % bolus 1,000 mL (1,000 mLs Intravenous New Bag/Given 10/31/17 1806)  ondansetron (ZOFRAN) injection 4 mg (4 mg Intravenous Given 10/31/17 1620)  sodium chloride 0.9 % bolus 1,000 mL (0 mLs Intravenous Stopped 10/31/17 1722)  ketorolac (TORADOL) 15 MG/ML injection 15 mg (15 mg Intravenous Given 10/31/17 1806)  acetaminophen (TYLENOL) tablet 1,000 mg (1,000 mg Oral Given 10/31/17 1807)     Initial Impression / Assessment and Plan / ED Course  I have reviewed the triage vital signs and the nursing notes.  Pertinent labs & imaging results that were available during my care of the patient were reviewed by me and considered in my medical decision making (see chart for details).      MDM Screen complete  Patient is presenting with nausea and vomiting. This may be secondary to recent use of augmentin or concurrent viral infection.   She appears to be mildly dehydrated on exam.  She has hyponatremia on screening labs.  She is unable to take PO, despite ED treatment - I will admit for further workup and treatment to the hospitalist service.  Final Clinical Impressions(s) / ED Diagnoses   Final diagnoses:  Hyponatremia    ED Discharge Orders    None       Wynetta Fines, MD 10/31/17 2326

## 2017-10-31 NOTE — H&P (Signed)
History and Physical    Patricia PersonsLisa R Junod WUJ:[id number] DOB: 02/04/1956 DOA: 10/31/2017  PCP: Patient, No Pcp Per  Patient coming from: Home  I have personally briefly reviewed patient's old medical records in [facility] Link  Chief Complaint: N/V  HPI: [name] is a 62 y.o. female with medical history significant of Chronic sinusitis.  Developed sinusitis again few days ago, went to PCP yesterday, given augmentin, took 2 doses, started having vomiting, some LUQ abd pain with emesis episodes.  Presents to ED due to not being able to take POs.   ED Course: Mild hyponatremia to 129.  LFTs nl.   Review of Systems: As per HPI otherwise 10 point review of systems negative.   Past Medical History:  Diagnosis Date  . Depression   . Glaucoma     Past Surgical History:  Procedure Laterality Date  . bladdder sling    . CARPAL TUNNEL RELEASE Right   . EYE SURGERY    . HERNIA REPAIR       reports that she has been smoking.  She uses smokeless tobacco. She reports that she drank alcohol. She reports that she does not use drugs.  Allergies  Allergen Reactions  . Erythromycin     GI upset  . Tetracyclines & Related     GI Upset    Family History  Problem Relation Age of Onset  . Heart failure Mother   . Glaucoma Mother   . Diabetes Mother   . Atrial fibrillation Mother      Prior to Admission medications   Medication Sig Start Date End Date Taking? Authorizing Provider  ALPHAGAN P 0.1 % SOLN Place 1 drop into both eyes daily.  08/12/17  Yes [provider]  amoxicillin-clavulanate (AUGMENTIN) 875-125 MG tablet TK 1 T PO BID FOR 7 DAYS 10/30/17  Yes [provider]  baclofen (LIORESAL) 20 MG tablet Take 20 mg by mouth daily.  10/17/17  Yes [provider]  chlorpheniramine-HYDROcodone (TUSSIONEX) 10-8 MG/5ML SUER SHAKE LQ AND TK 5 ML PO Q 12 H FOR UP TO 7 DAYS PRN PAIN 10/30/17  Yes [provider]  cholecalciferol (VITAMIN D) 1000 units  tablet Take 1,000 Units by mouth daily.   Yes [provider]  FLUoxetine (PROZAC) 20 MG capsule Take 20 mg by mouth daily.  10/30/17  Yes [provider]  Multiple Vitamins-Minerals (PRESERVISION AREDS) CAPS Take 1 capsule by mouth daily.   Yes [provider]  ondansetron (ZOFRAN-ODT) 8 MG disintegrating tablet Take 8 mg by mouth every 8 (eight) hours as needed for nausea or vomiting.  10/31/17  Yes [provider]  vitamin B-12 (CYANOCOBALAMIN) 1000 MCG tablet Take 1,000 mcg by mouth daily.   Yes [provider]    Physical Exam: Vitals:   10/31/17 1630 10/31/17 1700 10/31/17 1730 10/31/17 1808  BP: 126/69 119/68 124/72 (!) 155/81  Pulse: 70 73 72 72  Resp:  18 18 18   Temp:      TempSrc:      SpO2: 93% 90% 96% 100%  Weight:      Height:        Constitutional: NAD, calm, comfortable Eyes: PERRL, lids and conjunctivae normal ENMT: Mucous membranes are moist. Posterior pharynx clear of any exudate or lesions.Normal dentition.  Neck: normal, supple, no masses, no thyromegaly Respiratory: clear to auscultation bilaterally, no wheezing, no crackles. Normal respiratory effort. No accessory muscle use.  Cardiovascular: Regular rate and rhythm, no murmurs / rubs /  gallops. No extremity edema. 2+ pedal pulses. No carotid bruits.  Abdomen: no tenderness, no masses palpated. No hepatosplenomegaly. Bowel sounds positive.  Musculoskeletal: no clubbing / cyanosis. No joint deformity upper and lower extremities. Good ROM, no contractures. Normal muscle tone.  Skin: no rashes, lesions, ulcers. No induration Neurologic: CN 2-12 grossly intact. Sensation intact, DTR normal. Strength 5/5 in all 4.  Psychiatric: Normal judgment and insight. Alert and oriented x 3. Normal mood.    Labs on Admission: I have personally reviewed following labs and imaging studies  CBC: Recent Labs  Lab 10/31/17 1508  WBC 16.5*  HGB 12.6  HCT 38.2  MCV 81.8  PLT 364    Basic Metabolic Panel: Recent Labs  Lab 10/31/17 1508  NA 129*  K 3.5  CL 94*  CO2 26  GLUCOSE 114*  BUN 11  CREATININE 0.50  CALCIUM 8.5*   GFR: Estimated Creatinine Clearance: 83.6 mL/min (by C-G formula based on SCr of 0.5 mg/dL). Liver Function Tests: Recent Labs  Lab 10/31/17 1508  AST 23  ALT 29  ALKPHOS 95  BILITOT 0.6  PROT 6.8  ALBUMIN 3.5   Recent Labs  Lab 10/31/17 1508  LIPASE 21   No results for input(s): AMMONIA in the last 168 hours. Coagulation Profile: No results for input(s): INR, PROTIME in the last 168 hours. Cardiac Enzymes: No results for input(s): CKTOTAL, CKMB, CKMBINDEX, TROPONINI in the last 168 hours. BNP (last 3 results) No results for input(s): PROBNP in the last 8760 hours. HbA1C: No results for input(s): HGBA1C in the last 72 hours. CBG: No results for input(s): GLUCAP in the last 168 hours. Lipid Profile: No results for input(s): CHOL, HDL, LDLCALC, TRIG, CHOLHDL, LDLDIRECT in the last 72 hours. Thyroid Function Tests: No results for input(s): TSH, T4TOTAL, FREET4, T3FREE, THYROIDAB in the last 72 hours. Anemia Panel: No results for input(s): VITAMINB12, FOLATE, FERRITIN, TIBC, IRON, RETICCTPCT in the last 72 hours. Urine analysis:    Component Value Date/Time   COLORURINE YELLOW 10/31/2017 1619   APPEARANCEUR HAZY (A) 10/31/2017 1619   LABSPEC 1.019 10/31/2017 1619   PHURINE 8.0 10/31/2017 1619   GLUCOSEU NEGATIVE 10/31/2017 1619   HGBUR NEGATIVE 10/31/2017 1619   BILIRUBINUR NEGATIVE 10/31/2017 1619   KETONESUR 5 (A) 10/31/2017 1619   PROTEINUR NEGATIVE 10/31/2017 1619   NITRITE NEGATIVE 10/31/2017 1619   LEUKOCYTESUR NEGATIVE 10/31/2017 1619    Radiological Exams on Admission: No results found.  EKG: Independently reviewed.  Assessment/Plan Principal Problem:   Nausea and vomiting    1. N/V - probably viral gastroenteritis 1. Zofran 2. Phenergan if needed 3. IVF: NS at 125 cc/hr 4. Repeat CBC/BMP in  AM 5. Clear liquids 6. No h/o surgeries or risk factors for SBO 1. Will obtain KUB 2. If persists, then would obtain CT tomorrow as next step.  DVT prophylaxis: lovenox Code Status: Full Family Communication: No family in room Disposition Plan: Home after admit Consults called: None Admission status: Place in obs   GARDNER, Heywood Iles. DO [facility] Pager [phone]  If 7AM-7PM, please contact day team taking care of patient [email or web address] Password [email or web address]  10/31/2017, 8:05 PM

## 2017-10-31 NOTE — ED Notes (Signed)
ED TO INPATIENT HANDOFF REPORT  Name/Age/Gender [name] 62 y.o. female  Code Status    Code Status Orders  (From admission, onward)        Start     Ordered   10/31/17 1949  Full code  Continuous     10/31/17 1949    Code Status History    This patient has a current code status but no historical code status.      Home/SNF/Other Home  Chief Complaint Headache; Emesis; Back Pain  Level of Care/Admitting Diagnosis ED Disposition    ED Disposition Condition [facility]: [place] [[id number]]  Level of Care: Med-Surg [16]  Diagnosis: Nausea and vomiting [314970]  Admitting Physician: Etta Quill [[id number]]  Attending Physician: Etta Quill [[id number]]  PT Class (Do Not Modify): Observation [104]  PT Acc Code (Do Not Modify): Observation [10022]       Medical History Past Medical History:  Diagnosis Date  . Depression   . Glaucoma     Allergies Allergies  Allergen Reactions  . Erythromycin     GI upset  . Tetracyclines & Related     GI Upset    IV Location/Drains/Wounds Patient Lines/Drains/Airways Status   Active Line/Drains/Airways    Name:   Placement date:   Placement time:   Site:   Days:   Peripheral IV 10/31/17 Left Antecubital   10/31/17    1615    Antecubital   less than 1          Labs/Imaging Results for orders placed or performed during the hospital encounter of 10/31/17 (from the past 48 hour(s))  Lipase, blood     Status: None   Collection Time: 10/31/17  3:08 PM  Result Value Ref Range   Lipase 21 11 - 51 U/L    Comment: Performed at [facility], [place] [address], [place], [place] [address]  Comprehensive metabolic panel     Status: Abnormal   Collection Time: 10/31/17  3:08 PM  Result Value Ref Range   Sodium 129 (L) 135 - 145 mmol/L   Potassium 3.5 3.5 - 5.1 mmol/L   Chloride 94 (L) 101 - 111 mmol/L   CO2 26 22 - 32 mmol/L   Glucose, Bld 114 (H) 65 - 99 mg/dL   BUN 11 6 - 20 mg/dL   Creatinine, Ser 0.50 0.44 - 1.00 mg/dL   Calcium 8.5 (L) 8.9 - 10.3 mg/dL   Total Protein 6.8 6.5 - 8.1 g/dL   Albumin 3.5 3.5 - 5.0 g/dL   AST 23 15 - 41 U/L   ALT 29 14 - 54 U/L   Alkaline Phosphatase 95 38 - 126 U/L   Total Bilirubin 0.6 0.3 - 1.2 mg/dL   GFR calc non Af Amer >60 >60 mL/min   GFR calc Af Amer >60 >60 mL/min    Comment: (NOTE) The eGFR has been calculated using the CKD EPI equation. This calculation has not been validated in all clinical situations. eGFR's persistently <60 mL/min signify possible Chronic Kidney Disease.    Anion gap 9 5 - 15    Comment: Performed at [facility], [place] [address] [place], [place] [address]  CBC     Status: Abnormal   Collection Time: 10/31/17  3:08 PM  Result Value Ref Range   WBC 16.5 (H) 4.0 - 10.5 K/uL   RBC 4.67 3.87 - 5.11 MIL/uL   Hemoglobin 12.6 12.0 -  15.0 g/dL   HCT 38.2 36.0 - 46.0 %   MCV 81.8 78.0 - 100.0 fL   MCH 27.0 26.0 - 34.0 pg   MCHC 33.0 30.0 - 36.0 g/dL   RDW 13.4 11.5 - 15.5 %   Platelets 364 150 - 400 K/uL    Comment: Performed at [facility], [place] [address] [place], [place] [address]  Urinalysis, Routine w reflex microscopic     Status: Abnormal   Collection Time: 10/31/17  4:19 PM  Result Value Ref Range   Color, Urine YELLOW YELLOW   APPearance HAZY (A) CLEAR   Specific Gravity, Urine 1.019 1.005 - 1.030   pH 8.0 5.0 - 8.0   Glucose, UA NEGATIVE NEGATIVE mg/dL   Hgb urine dipstick NEGATIVE NEGATIVE   Bilirubin Urine NEGATIVE NEGATIVE   Ketones, ur 5 (A) NEGATIVE mg/dL   Protein, ur NEGATIVE NEGATIVE mg/dL   Nitrite NEGATIVE NEGATIVE   Leukocytes, UA NEGATIVE NEGATIVE    Comment: Performed at [place] [address], [place], [place] [address]   Dg Abd 1 View  Result Date: 10/31/2017 CLINICAL DATA:  Vomiting and abdominal pain. EXAM: ABDOMEN - 1 VIEW COMPARISON:  Lumbar spine x-rays dated May 23, 2017.  FINDINGS: The bowel gas pattern is normal. Mild colonic stool burden. No radio-opaque calculi or other significant radiographic abnormality are seen. Probable phlebolith in the right pelvis, unchanged. IMPRESSION: Negative. Electronically Signed   By: Titus Dubin M.D.   On: 10/31/2017 20:45    Pending Labs Unresulted Labs (From admission, onward)   Start     Ordered   11/01/17 0500  CBC  Tomorrow morning,   R     10/31/17 1949   11/01/17 1155  Basic metabolic panel  Tomorrow morning,   R     10/31/17 1949   10/31/17 1948  HIV antibody (Routine Testing)  Once,   R     10/31/17 1949      Vitals/Pain Today's Vitals   10/31/17 1700 10/31/17 1730 10/31/17 1808 10/31/17 1851  BP: 119/68 124/72 (!) 155/81   Pulse: 73 72 72   Resp: '18 18 18   ' Temp:      TempSrc:      SpO2: 90% 96% 100%   Weight:      Height:      PainSc:    6     Isolation Precautions No active isolations  Medications Medications  acetaminophen (TYLENOL) tablet 650 mg (has no administration in time range)    Or  acetaminophen (TYLENOL) suppository 650 mg (has no administration in time range)  ondansetron (ZOFRAN) tablet 4 mg (has no administration in time range)    Or  ondansetron (ZOFRAN) injection 4 mg (has no administration in time range)  enoxaparin (LOVENOX) injection 40 mg (has no administration in time range)  0.9 %  sodium chloride infusion (has no administration in time range)  baclofen (LIORESAL) tablet 20 mg (has no administration in time range)  chlorpheniramine-HYDROcodone (TUSSIONEX) 10-8 MG/5ML suspension 5 mL (has no administration in time range)  FLUoxetine (PROZAC) capsule 20 mg (has no administration in time range)  PRESERVISION AREDS CAPS 1 capsule (has no administration in time range)  vitamin B-12 (CYANOCOBALAMIN) tablet 1,000 mcg (has no administration in time range)  cholecalciferol (VITAMIN D) tablet 1,000 Units (has no administration in time range)  brimonidine (ALPHAGAN) 0.15 %  ophthalmic solution 1 drop (has no administration in time range)  promethazine (PHENERGAN) injection 12.5 mg (has  no administration in time range)  ondansetron (ZOFRAN) injection 4 mg (4 mg Intravenous Given 10/31/17 1620)  sodium chloride 0.9 % bolus 1,000 mL (0 mLs Intravenous Stopped 10/31/17 1722)  sodium chloride 0.9 % bolus 1,000 mL (0 mLs Intravenous Stopped 10/31/17 1922)  ketorolac (TORADOL) 15 MG/ML injection 15 mg (15 mg Intravenous Given 10/31/17 1806)  acetaminophen (TYLENOL) tablet 1,000 mg (1,000 mg Oral Given 10/31/17 1807)  ondansetron (ZOFRAN) injection 4 mg (4 mg Intravenous Given 10/31/17 1935)  0.9 %  sodium chloride infusion ( Intravenous New Bag/Given 10/31/17 1931)    Mobility walks

## 2017-10-31 NOTE — ED Triage Notes (Signed)
Patient c/o headache, sinus congestion. Patient states she went to her PCp yesterday and was given Augmentin. Patient states after 2 doses she began vomiting and having left upper abdominal pain.. Patient also c/o mid back pain that started today. Patient denies any dysuria

## 2017-11-01 DIAGNOSIS — R112 Nausea with vomiting, unspecified: Secondary | ICD-10-CM | POA: Diagnosis not present

## 2017-11-01 DIAGNOSIS — E871 Hypo-osmolality and hyponatremia: Secondary | ICD-10-CM | POA: Diagnosis not present

## 2017-11-01 LAB — BASIC METABOLIC PANEL
Anion gap: 6 (ref 5–15)
BUN: 8 mg/dL (ref 6–20)
CHLORIDE: 109 mmol/L (ref 101–111)
CO2: 26 mmol/L (ref 22–32)
Calcium: 8.3 mg/dL — ABNORMAL LOW (ref 8.9–10.3)
Creatinine, Ser: 0.55 mg/dL (ref 0.44–1.00)
GFR calc Af Amer: >60 mL/min (ref >60)
GFR calc non Af Amer: >60 mL/min (ref >60)
GLUCOSE: 106 mg/dL — AB (ref 65–99)
Potassium: 3.7 mmol/L (ref 3.5–5.1)
Sodium: 141 mmol/L (ref 135–145)

## 2017-11-01 LAB — CBC
HCT: 38.5 % (ref 36.0–46.0)
HEMOGLOBIN: 12.4 g/dL (ref 12.0–15.0)
MCH: 26.8 pg (ref 26.0–34.0)
MCHC: 32.2 g/dL (ref 30.0–36.0)
MCV: 83.2 fL (ref 78.0–100.0)
Platelets: 328 10*3/uL (ref 150–400)
RBC: 4.63 MIL/uL (ref 3.87–5.11)
RDW: 13.8 % (ref 11.5–15.5)
WBC: 11.9 10*3/uL — ABNORMAL HIGH (ref 4.0–10.5)

## 2017-11-01 LAB — HIV ANTIBODY (ROUTINE TESTING W REFLEX): HIV Screen 4th Generation wRfx: NONREACTIVE

## 2017-11-01 MED ORDER — BUTALBITAL-APAP-CAFFEINE 50-325-40 MG PO TABS
1.0000 | ORAL_TABLET | Freq: Four times a day (QID) | ORAL | Status: DC | PRN
  Administered 2017-11-01: 1 via ORAL
  Filled 2017-11-01: qty 1

## 2017-11-01 MED ORDER — PROSIGHT PO TABS
1.0000 | ORAL_TABLET | Freq: Every day | ORAL | Status: DC
  Administered 2017-11-01 – 2017-11-03 (×3): 1 via ORAL
  Filled 2017-11-01 (×3): qty 1

## 2017-11-01 NOTE — Progress Notes (Signed)
PROGRESS NOTE    [name]  NFA:[id number] DOB: 02/04/1956 DOA: 10/31/2017 PCP: Patient, No Pcp Per   Brief Narrative:  [name] is a 62 y.o. female with medical history significant of Chronic sinusitis.  Developed sinusitis again few days ago, went to PCP yesterday, given augmentin, took 2 doses, started having vomiting, some LUQ abd pain with emesis episodes.  Presents to ED due to not being able to take POs and intractable Nausea and Vomiting. Still continues to have Nausea and Vomited once this AM. Also complaining of a headache.   Assessment & Plan:   Principal Problem:   Nausea and vomiting  Intractable Nausea and Vomiting  -Patient continues to Have Nausea and Vomiting -Given IV NS a 2 Liters.  -C/w Supportive Care and IVF Rehydration with NS at 100 mL/hr -Antiemetics with po/IV Zofran 4 mg q6hprn and IV Promethazine 12.5 mg q6hprn N/V -Abdominal Flat Plate showed The bowel gas pattern is normal. Mild colonic stool burden. No radio-opaque calculi or other significant radiographic abnormality are seen. Probable phlebolith in the right pelvis, unchanged.  Headache -Given Acetaminophen 1,000 mg po x1 last night along with Ketorolac 15 mg IV Once -Started Butalbital-Acetaminophen-Caffeine 1 tablet po q6hprn Headache  Glaucoma -C/w Brimonidine 0.15% drop Both Eyes Daily   Depression -C/w Fluoxetine 20 mg po Daily if tolerated  Leukocytosis -Likely reactive from N/V. -WBC went from 16.5 -> 11.9 -Continue to Monitor and Repeat CBC in AM  Hyponatremia -Patient's Na+ was 129 and improved to 141 after IVF Rehydration -Continue to Monitor and Repeat CMP in AM   Sinusitis -C/w Supportive Care  DVT prophylaxis: Enoxaparin 40 mg sq q24h  Code Status: FULL CODE Family Communication: No family present at bedside  Disposition Plan: Remain Inpatient for continued Treatment  Consultants:  None   Procedures: None   Antimicrobials:  Anti-infectives (From admission,  onward)   None     Subjective: Seen and examined and complained of a headache. Also had Nausea and Vomiting. No CP or SOB. Still felt bad.   Objective: Vitals:   10/31/17 2101 10/31/17 2135 11/01/17 0552 11/01/17 1418  BP: 117/65 129/67 134/71 132/67  Pulse: 77 69 65 64  Resp: 11 15 16 18   Temp:  97.7 F (36.5 C) 98.2 F (36.8 C) 98.4 F (36.9 C)  TempSrc:  Oral Oral Oral  SpO2: 99% 96% 92% 95%  Weight:      Height:        Intake/Output Summary (Last 24 hours) at 11/01/2017 1602 Last data filed at 11/01/2017 1500 Gross per 24 hour  Intake 3524.16 ml  Output -  Net 3524.16 ml   Filed Weights   10/31/17 1255  Weight: 92.7 kg (204 lb 5 oz)   Examination: Physical Exam:  Constitutional: WN/WD obese Caucasian female in NAD and appears calm but uncomfortable Eyes: Lids and conjunctivae normal, sclerae anicteric  ENMT: External Ears, Nose appear normal. Grossly normal hearing. Mucous membranes are moist. Neck: Appears normal, supple, no cervical masses, normal ROM, no appreciable thyromegaly; no JVD Respiratory: Diminished to auscultation bilaterally, no wheezing, rales, rhonchi or crackles. Normal respiratory effort and patient is not tachypenic. No accessory muscle use.  Cardiovascular: RRR, no murmurs / rubs / gallops. S1 and S2 auscultated. No extremity edema.  Abdomen: Soft, non-tender, non-distended. No masses palpated. No appreciable hepatosplenomegaly. Bowel sounds positive x4 GU: Deferred. Musculoskeletal: No clubbing / cyanosis of digits/nails. No joint deformity upper and lower extremities. Good ROM, no contractures. Skin: No rashes,  lesions, ulcers on a limited skin eval. No induration; Warm and dry.  Neurologic: CN 2-12 grossly intact with no focal deficits. Romberg sign and cerebellar reflexes not assessed.  Psychiatric: Normal judgment and insight. Alert and oriented x 3. Normal mood and appropriate affect.   Data Reviewed: I have personally reviewed following  labs and imaging studies  CBC: Recent Labs  Lab 10/31/17 1508 11/01/17 0552  WBC 16.5* 11.9*  HGB 12.6 12.4  HCT 38.2 38.5  MCV 81.8 83.2  PLT 364 328   Basic Metabolic Panel: Recent Labs  Lab 10/31/17 1508 11/01/17 0552  NA 129* 141  K 3.5 3.7  CL 94* 109  CO2 26 26  GLUCOSE 114* 106*  BUN 11 8  CREATININE 0.50 0.55  CALCIUM 8.5* 8.3*   GFR: Estimated Creatinine Clearance: 83.6 mL/min (by C-G formula based on SCr of 0.55 mg/dL). Liver Function Tests: Recent Labs  Lab 10/31/17 1508  AST 23  ALT 29  ALKPHOS 95  BILITOT 0.6  PROT 6.8  ALBUMIN 3.5   Recent Labs  Lab 10/31/17 1508  LIPASE 21   No results for input(s): AMMONIA in the last 168 hours. Coagulation Profile: No results for input(s): INR, PROTIME in the last 168 hours. Cardiac Enzymes: No results for input(s): CKTOTAL, CKMB, CKMBINDEX, TROPONINI in the last 168 hours. BNP (last 3 results) No results for input(s): PROBNP in the last 8760 hours. HbA1C: No results for input(s): HGBA1C in the last 72 hours. CBG: No results for input(s): GLUCAP in the last 168 hours. Lipid Profile: No results for input(s): CHOL, HDL, LDLCALC, TRIG, CHOLHDL, LDLDIRECT in the last 72 hours. Thyroid Function Tests: No results for input(s): TSH, T4TOTAL, FREET4, T3FREE, THYROIDAB in the last 72 hours. Anemia Panel: No results for input(s): VITAMINB12, FOLATE, FERRITIN, TIBC, IRON, RETICCTPCT in the last 72 hours. Sepsis Labs: No results for input(s): PROCALCITON, LATICACIDVEN in the last 168 hours.  No results found for this or any previous visit (from the past 240 hour(s)).   Radiology Studies: Dg Abd 1 View  Result Date: 10/31/2017 CLINICAL DATA:  Vomiting and abdominal pain. EXAM: ABDOMEN - 1 VIEW COMPARISON:  Lumbar spine x-rays dated May 23, 2017. FINDINGS: The bowel gas pattern is normal. Mild colonic stool burden. No radio-opaque calculi or other significant radiographic abnormality are seen. Probable  phlebolith in the right pelvis, unchanged. IMPRESSION: Negative. Electronically Signed   By: Obie DredgeWilliam T Derry M.D.   On: 10/31/2017 20:45   Scheduled Meds: . baclofen  20 mg Oral Daily  . brimonidine  1 drop Both Eyes Daily  . cholecalciferol  1,000 Units Oral Daily  . enoxaparin (LOVENOX) injection  40 mg Subcutaneous Q24H  . FLUoxetine  20 mg Oral Daily  . multivitamin  1 tablet Oral Daily  . vitamin B-12  1,000 mcg Oral Daily   Continuous Infusions: . sodium chloride 100 mL/hr at 11/01/17 1140    LOS: 0 days   Merlene Laughtermair Latif Dempsey Ahonen, DO [facility] Pager [phone]  If 7PM-7AM, please contact night-coverage [email or web address] Password [email or web address] 11/01/2017, 4:02 PM

## 2017-11-01 NOTE — Progress Notes (Signed)
Pt has arrived from the ED, denies pain or nausea at the moment. Sinus congestion & cough is the main problem currently. Orders reviewed

## 2017-11-02 DIAGNOSIS — R51 Headache: Secondary | ICD-10-CM

## 2017-11-02 DIAGNOSIS — E871 Hypo-osmolality and hyponatremia: Secondary | ICD-10-CM | POA: Diagnosis not present

## 2017-11-02 DIAGNOSIS — R112 Nausea with vomiting, unspecified: Secondary | ICD-10-CM | POA: Diagnosis not present

## 2017-11-02 DIAGNOSIS — F329 Major depressive disorder, single episode, unspecified: Secondary | ICD-10-CM

## 2017-11-02 DIAGNOSIS — J019 Acute sinusitis, unspecified: Secondary | ICD-10-CM | POA: Diagnosis not present

## 2017-11-02 DIAGNOSIS — R519 Headache, unspecified: Secondary | ICD-10-CM | POA: Diagnosis present

## 2017-11-02 DIAGNOSIS — D72829 Elevated white blood cell count, unspecified: Secondary | ICD-10-CM | POA: Diagnosis not present

## 2017-11-02 DIAGNOSIS — H409 Unspecified glaucoma: Secondary | ICD-10-CM | POA: Diagnosis not present

## 2017-11-02 DIAGNOSIS — K5900 Constipation, unspecified: Secondary | ICD-10-CM | POA: Diagnosis present

## 2017-11-02 DIAGNOSIS — E860 Dehydration: Secondary | ICD-10-CM | POA: Diagnosis present

## 2017-11-02 DIAGNOSIS — F32A Depression, unspecified: Secondary | ICD-10-CM | POA: Diagnosis present

## 2017-11-02 LAB — CBC WITH DIFFERENTIAL/PLATELET
BASOS ABS: 0 10*3/uL (ref 0.0–0.1)
Basophils Relative: 0 %
Eosinophils Absolute: 0.3 10*3/uL (ref 0.0–0.7)
Eosinophils Relative: 3 %
HEMATOCRIT: 35.4 % — AB (ref 36.0–46.0)
Hemoglobin: 11.3 g/dL — ABNORMAL LOW (ref 12.0–15.0)
Lymphocytes Relative: 40 %
Lymphs Abs: 3.9 10*3/uL (ref 0.7–4.0)
MCH: 27 pg (ref 26.0–34.0)
MCHC: 31.9 g/dL (ref 30.0–36.0)
MCV: 84.5 fL (ref 78.0–100.0)
MONO ABS: 0.9 10*3/uL (ref 0.1–1.0)
MONOS PCT: 9 %
NEUTROS ABS: 4.5 10*3/uL (ref 1.7–7.7)
Neutrophils Relative %: 48 %
Platelets: 320 10*3/uL (ref 150–400)
RBC: 4.19 MIL/uL (ref 3.87–5.11)
RDW: 14.2 % (ref 11.5–15.5)
WBC: 9.6 10*3/uL (ref 4.0–10.5)

## 2017-11-02 LAB — COMPREHENSIVE METABOLIC PANEL
ALT: 19 U/L (ref 14–54)
AST: 16 U/L (ref 15–41)
Albumin: 2.9 g/dL — ABNORMAL LOW (ref 3.5–5.0)
Alkaline Phosphatase: 75 U/L (ref 38–126)
Anion gap: 6 (ref 5–15)
BILIRUBIN TOTAL: 0.5 mg/dL (ref 0.3–1.2)
BUN: 5 mg/dL — AB (ref 6–20)
CALCIUM: 8.1 mg/dL — AB (ref 8.9–10.3)
CO2: 27 mmol/L (ref 22–32)
CREATININE: 0.54 mg/dL (ref 0.44–1.00)
Chloride: 109 mmol/L (ref 101–111)
GFR calc Af Amer: >60 mL/min (ref >60)
Glucose, Bld: 99 mg/dL (ref 65–99)
POTASSIUM: 3.4 mmol/L — AB (ref 3.5–5.1)
Sodium: 142 mmol/L (ref 135–145)
TOTAL PROTEIN: 5.8 g/dL — AB (ref 6.5–8.1)

## 2017-11-02 LAB — MAGNESIUM: MAGNESIUM: 1.8 mg/dL (ref 1.7–2.4)

## 2017-11-02 LAB — PHOSPHORUS: Phosphorus: 2.7 mg/dL (ref 2.5–4.6)

## 2017-11-02 MED ORDER — FLUTICASONE PROPIONATE 50 MCG/ACT NA SUSP
2.0000 | Freq: Every day | NASAL | Status: DC
  Administered 2017-11-02 – 2017-11-03 (×2): 2 via NASAL
  Filled 2017-11-02: qty 16

## 2017-11-02 MED ORDER — POTASSIUM CHLORIDE CRYS ER 10 MEQ PO TBCR
40.0000 meq | EXTENDED_RELEASE_TABLET | Freq: Once | ORAL | Status: AC
  Administered 2017-11-02: 40 meq via ORAL
  Filled 2017-11-02: qty 4

## 2017-11-02 MED ORDER — PROCHLORPERAZINE EDISYLATE 5 MG/ML IJ SOLN
10.0000 mg | Freq: Four times a day (QID) | INTRAMUSCULAR | Status: DC | PRN
Start: 2017-11-02 — End: 2017-11-03

## 2017-11-02 MED ORDER — MAGNESIUM SULFATE 4 GM/100ML IV SOLN
4.0000 g | Freq: Once | INTRAVENOUS | Status: AC
  Administered 2017-11-02: 4 g via INTRAVENOUS
  Filled 2017-11-02: qty 100

## 2017-11-02 MED ORDER — POLYETHYLENE GLYCOL 3350 17 G PO PACK
17.0000 g | PACK | Freq: Every day | ORAL | Status: DC
  Administered 2017-11-02 – 2017-11-03 (×2): 17 g via ORAL
  Filled 2017-11-02 (×2): qty 1

## 2017-11-02 MED ORDER — SENNOSIDES-DOCUSATE SODIUM 8.6-50 MG PO TABS
1.0000 | ORAL_TABLET | Freq: Two times a day (BID) | ORAL | Status: DC
  Administered 2017-11-02 – 2017-11-03 (×2): 1 via ORAL
  Filled 2017-11-02 (×2): qty 1

## 2017-11-02 MED ORDER — SODIUM CHLORIDE 0.9 % IV SOLN
500.0000 mg | INTRAVENOUS | Status: DC
  Administered 2017-11-02 – 2017-11-03 (×2): 500 mg via INTRAVENOUS
  Filled 2017-11-02 (×2): qty 500

## 2017-11-02 NOTE — Progress Notes (Signed)
PROGRESS NOTE    Patricia PersonsLisa R Lemler  UJW:[id number] DOB: 02/04/1956 DOA: 10/31/2017 PCP: Patient, No Pcp Per    Brief Narrative:  Patricia SneddonLisa R Youngis a 62 y.o.femalewith medical history significant ofChronic sinusitis. Developed sinusitis again few days ago, went to PCP yesterday, given augmentin, took 2 doses, started having vomiting, some LUQ abd pain with emesis episodes. Presents to ED due to not being able to take POs and intractable Nausea and Vomiting.      Assessment & Plan:   Principal Problem:   Nausea and vomiting Active Problems:   Constipation   Dehydration   Depression   Sinusitis, acute   Headache   Hyponatremia   Leukocytosis   Glaucoma  #1 intractable nausea and vomiting Questionable etiology.  May likely be secondary to probable sinusitis and possible constipation.  Patient has had a bowel movement.  Patient with clinical improvement.  Continue IV fluids, antiemetics, supportive care.  Place on a bowel regimen.  Follow.  2.  Constipation Patient states had a bowel movement this morning.  Placed on MiraLAX daily as well as Senokot S2 twice daily.  Mobilize.  Follow.  3.  Acute sinusitis Prior to admission patient was diagnosed with an acute sinusitis and started on Augmentin per PCP however developed nausea and vomiting.  Placed on IV azithromycin and transition to oral azithromycin also once tolerating oral intake.  4.  Dehydration IV fluids.  5.  Headache Likely secondary to problem #3.  Patient received a dose of Tylenol 1 g p.o. x1 with Toradol 50 mg IV x1.  Patient started on Fioricet as needed for headache.  6.  Depression Continue fluoxetine 20 mg daily.  7.  Glaucoma Continue eyedrops.  8.  Leukocytosis Likely a reactive leukocytosis.  Patient afebrile.  Urinalysis was unremarkable.  Patient with no respiratory symptoms.  WBC has trended down.  No need for antibiotics.  9.  Hyponatremia Secondary to volume depletion.  Improved with hydration.   Follow.  10.  Hypokalemia Secondary to GI losses.  Replete.  Keep Magnesium greater than 2.   DVT prophylaxis: Lovenox Code Status: Full Family Communication: Updated patient.  No family at bedside. Disposition Plan: Likely home once nausea vomiting abdominal pain resolved and patient tolerating solid food, hopefully in the next 24-48 hours.   Consultants:   None  Procedures:   Abdominal films 10/31/2017    Antimicrobials:   IV azithromycin 11/02/2017   Subjective: Laying in bed.  Patient states improvement with nausea and emesis since 4 PM yesterday afternoon feels like a switch suddenly came on.  Patient currently tolerating clears.  Patient stated had a bowel movement this morning.  Objective: Vitals:   11/01/17 0552 11/01/17 1418 11/01/17 2141 11/02/17 0458  BP: 134/71 132/67 (!) 148/93 105/70  Pulse: 65 64 (!) 104 (!) 58  Resp: 16 18 20 20   Temp: 98.2 F (36.8 C) 98.4 F (36.9 C) 98.1 F (36.7 C) 97.9 F (36.6 C)  TempSrc: Oral Oral Oral Oral  SpO2: 92% 95% 98% 98%  Weight:      Height:        Intake/Output Summary (Last 24 hours) at 11/02/2017 1154 Last data filed at 11/02/2017 1054 Gross per 24 hour  Intake 3099.58 ml  Output -  Net 3099.58 ml   Filed Weights   10/31/17 1255  Weight: 92.7 kg (204 lb 5 oz)    Examination:  General exam: Appears calm and comfortable  Respiratory system: Clear to auscultation.  No wheezes.  No  crackles.  No rhonchi.  Respiratory effort normal. Cardiovascular system: S1 & S2 heard, RRR. No JVD, murmurs, rubs, gallops or clicks. No pedal edema. Gastrointestinal system: Abdomen is nondistended, soft and nontender. No organomegaly or masses felt. Normal bowel sounds heard. Central nervous system: Alert and oriented. No focal neurological deficits. Extremities: Symmetric 5 x 5 power. Skin: No rashes, lesions or ulcers Psychiatry: Judgement and insight appear normal. Mood & affect appropriate.     Data Reviewed: I have  personally reviewed following labs and imaging studies  CBC: Recent Labs  Lab 10/31/17 1508 11/01/17 0552 11/02/17 0600  WBC 16.5* 11.9* 9.6  NEUTROABS  --   --  4.5  HGB 12.6 12.4 11.3*  HCT 38.2 38.5 35.4*  MCV 81.8 83.2 84.5  PLT 364 328 320   Basic Metabolic Panel: Recent Labs  Lab 10/31/17 1508 11/01/17 0552 11/02/17 0600  NA 129* 141 142  K 3.5 3.7 3.4*  CL 94* 109 109  CO2 26 26 27   GLUCOSE 114* 106* 99  BUN 11 8 5*  CREATININE 0.50 0.55 0.54  CALCIUM 8.5* 8.3* 8.1*  MG  --   --  1.8  PHOS  --   --  2.7   GFR: Estimated Creatinine Clearance: 83.6 mL/min (by C-G formula based on SCr of 0.54 mg/dL). Liver Function Tests: Recent Labs  Lab 10/31/17 1508 11/02/17 0600  AST 23 16  ALT 29 19  ALKPHOS 95 75  BILITOT 0.6 0.5  PROT 6.8 5.8*  ALBUMIN 3.5 2.9*   Recent Labs  Lab 10/31/17 1508  LIPASE 21   No results for input(s): AMMONIA in the last 168 hours. Coagulation Profile: No results for input(s): INR, PROTIME in the last 168 hours. Cardiac Enzymes: No results for input(s): CKTOTAL, CKMB, CKMBINDEX, TROPONINI in the last 168 hours. BNP (last 3 results) No results for input(s): PROBNP in the last 8760 hours. HbA1C: No results for input(s): HGBA1C in the last 72 hours. CBG: No results for input(s): GLUCAP in the last 168 hours. Lipid Profile: No results for input(s): CHOL, HDL, LDLCALC, TRIG, CHOLHDL, LDLDIRECT in the last 72 hours. Thyroid Function Tests: No results for input(s): TSH, T4TOTAL, FREET4, T3FREE, THYROIDAB in the last 72 hours. Anemia Panel: No results for input(s): VITAMINB12, FOLATE, FERRITIN, TIBC, IRON, RETICCTPCT in the last 72 hours. Sepsis Labs: No results for input(s): PROCALCITON, LATICACIDVEN in the last 168 hours.  No results found for this or any previous visit (from the past 240 hour(s)).       Radiology Studies: Dg Abd 1 View  Result Date: 10/31/2017 CLINICAL DATA:  Vomiting and abdominal pain. EXAM: ABDOMEN -  1 VIEW COMPARISON:  Lumbar spine x-rays dated May 23, 2017. FINDINGS: The bowel gas pattern is normal. Mild colonic stool burden. No radio-opaque calculi or other significant radiographic abnormality are seen. Probable phlebolith in the right pelvis, unchanged. IMPRESSION: Negative. Electronically Signed   By: Obie Dredge M.D.   On: 10/31/2017 20:45        Scheduled Meds: . baclofen  20 mg Oral Daily  . brimonidine  1 drop Both Eyes Daily  . cholecalciferol  1,000 Units Oral Daily  . enoxaparin (LOVENOX) injection  40 mg Subcutaneous Q24H  . FLUoxetine  20 mg Oral Daily  . fluticasone  2 spray Each Nare Daily  . multivitamin  1 tablet Oral Daily  . vitamin B-12  1,000 mcg Oral Daily   Continuous Infusions: . sodium chloride 100 mL/hr (11/02/17 0900)  . azithromycin  Stopped (11/02/17 1021)     LOS: 0 days    Time spent: 35 minutes    Ramiro Harvest, MD [facility] Pager [phone]  If 7PM-7AM, please contact night-coverage [email or web address] Password [facility] 11/02/2017, 11:54 AM

## 2017-11-03 DIAGNOSIS — R51 Headache: Secondary | ICD-10-CM

## 2017-11-03 DIAGNOSIS — R112 Nausea with vomiting, unspecified: Secondary | ICD-10-CM | POA: Diagnosis not present

## 2017-11-03 DIAGNOSIS — E860 Dehydration: Secondary | ICD-10-CM | POA: Diagnosis not present

## 2017-11-03 DIAGNOSIS — H409 Unspecified glaucoma: Secondary | ICD-10-CM

## 2017-11-03 DIAGNOSIS — K5900 Constipation, unspecified: Secondary | ICD-10-CM | POA: Diagnosis not present

## 2017-11-03 DIAGNOSIS — E871 Hypo-osmolality and hyponatremia: Secondary | ICD-10-CM | POA: Diagnosis not present

## 2017-11-03 LAB — MAGNESIUM: Magnesium: 1.9 mg/dL (ref 1.7–2.4)

## 2017-11-03 LAB — BASIC METABOLIC PANEL
Anion gap: 8 (ref 5–15)
BUN: 5 mg/dL — AB (ref 6–20)
CO2: 28 mmol/L (ref 22–32)
Calcium: 8.6 mg/dL — ABNORMAL LOW (ref 8.9–10.3)
Chloride: 107 mmol/L (ref 101–111)
Creatinine, Ser: 0.63 mg/dL (ref 0.44–1.00)
GFR calc Af Amer: >60 mL/min (ref >60)
Glucose, Bld: 97 mg/dL (ref 65–99)
POTASSIUM: 3.7 mmol/L (ref 3.5–5.1)
Sodium: 143 mmol/L (ref 135–145)

## 2017-11-03 LAB — CBC
HCT: 38.4 % (ref 36.0–46.0)
Hemoglobin: 12.4 g/dL (ref 12.0–15.0)
MCH: 26.9 pg (ref 26.0–34.0)
MCHC: 32.3 g/dL (ref 30.0–36.0)
MCV: 83.3 fL (ref 78.0–100.0)
PLATELETS: 335 10*3/uL (ref 150–400)
RBC: 4.61 MIL/uL (ref 3.87–5.11)
RDW: 14 % (ref 11.5–15.5)
WBC: 10.4 10*3/uL (ref 4.0–10.5)

## 2017-11-03 MED ORDER — AZITHROMYCIN 500 MG PO TABS: 500.0000 mg | ORAL_TABLET | Freq: Every day | ORAL | 0 refills | Status: AC

## 2017-11-03 MED ORDER — BUTALBITAL-APAP-CAFFEINE 50-325-40 MG PO TABS: 1.0000 | ORAL_TABLET | Freq: Four times a day (QID) | ORAL | 0 refills | Status: DC | PRN

## 2017-11-03 MED ORDER — FLUTICASONE PROPIONATE 50 MCG/ACT NA SUSP: 2.0000 | Freq: Every day | NASAL | 0 refills | Status: DC

## 2017-11-03 MED ORDER — SENNOSIDES-DOCUSATE SODIUM 8.6-50 MG PO TABS: 1.0000 | ORAL_TABLET | Freq: Two times a day (BID) | ORAL | Status: DC

## 2017-11-03 MED ORDER — POLYETHYLENE GLYCOL 3350 17 G PO PACK: 17.0000 g | PACK | Freq: Every day | ORAL | 0 refills | Status: DC

## 2017-11-03 NOTE — Discharge Summary (Signed)
Physician Discharge Summary  ERMEL VERNE ZOX:[id number] DOB: 02/04/1956 DOA: 10/31/2017  PCP: Patient, No Pcp Per  Admit date: 10/31/2017 Discharge date: 11/03/2017  Time spent: 50 minutes  Recommendations for Outpatient Follow-up:  1. Follow-up with PCP in 1-2 weeks.  On follow-up patient will need a basic metabolic profile done to follow-up on electrolytes and renal function.  Patient's acute sinusitis will need to be followed up upon.   Discharge Diagnoses:  Principal Problem:   Nausea and vomiting Active Problems:   Constipation   Dehydration   Depression   Sinusitis, acute   Headache   Hyponatremia   Leukocytosis   Glaucoma   Discharge Condition: Stable and improved  Diet recommendation: Regular  Filed Weights   10/31/17 1255  Weight: 92.7 kg (204 lb 5 oz)    History of present illness:  Per Dr Edyth Gunnels is a 62 y.o. female with medical history significant of Chronic sinusitis.  Developed sinusitis again few days ago, went to PCP 1 day prior to admission, given augmentin, took 2 doses, started having vomiting, some LUQ abd pain with emesis episodes.  Presented to ED due to not being able to take POs.   ED Course: Mild hyponatremia to 129.  LFTs nl.    Hospital Course:  #1 intractable nausea and vomiting Questionable etiology.  May likely be secondary to probable sinusitis and possible constipation.    Patient was admitted, initially placed on bowel rest and then clear liquid diet.  Patient was placed on IV fluids as well as a bowel regimen.  Patient improved slowly.  Patient improved clinically with antibiotics.  Patient started to have bowel movement.  Patient was also placed empirically on IV azithromycin for sinusitis.  Patient was started on clear liquids and diet advanced to a solid diet which he tolerated.  Patient had no further nausea or vomiting and be discharged home in stable and improved condition.  Outpatient follow-up.   2.   Constipation Patient was placed on MiraLAX as well as Senokot with good results from the hospitalization.  Patient will be discharged on his bowel regimen.    3.  Acute sinusitis Prior to admission patient was diagnosed with an acute sinusitis and started on Augmentin per PCP however developed nausea and vomiting.  During the hospitalization patient placed on IV azithromycin and subsequently transitioned to oral azithromycin for 3 more days to complete a 5-day course of treatment.  Outpatient follow-up with PCP.   4.  Dehydration Patient hydrated with IV fluids.  5.  Headache Likely secondary to problem #3.  Patient received a dose of Tylenol 1 g p.o. x1 with Toradol 50 mg IV x1.  Patient started on Fioricet as needed for headache.  Patient also started on azithromycin.  Headaches improved and had resolved by day of discharge.  6.  Depression Patient maintained on home regimen of fluoxetine 20 mg daily.  7.  Glaucoma Continued on home eyedrops.  8.  Leukocytosis Likely a reactive leukocytosis.  Patient remained afebrile.  Urinalysis was unremarkable.  Patient with no respiratory symptoms.  WBC has trended down.  9.  Hyponatremia Secondary to volume depletion.  Patient hydrated with IV fluids with resolution of hyponatremia. Follow.  10.  Hypokalemia Secondary to GI losses.  Repleted.        Procedures:  Abdominal films 10/31/2017      Consultations:  None  Discharge Exam: Vitals:   11/03/17 0431 11/03/17 1430  BP: 128/67 (!) 144/81  Pulse: 63  71  Resp: 20 18  Temp: 98.2 F (36.8 C) 98.2 F (36.8 C)  SpO2: 95% 99%    General: NAD Cardiovascular: RRR Respiratory: CTAB  Discharge Instructions   Discharge Instructions    Diet general   Complete by:  As directed    Increase activity slowly   Complete by:  As directed      Allergies as of 11/03/2017      Reactions   Erythromycin    GI upset   Tetracyclines & Related    GI Upset       Medication List    STOP taking these medications   amoxicillin-clavulanate 875-125 MG tablet Commonly known as:  AUGMENTIN     TAKE these medications   ALPHAGAN P 0.1 % Soln Generic drug:  brimonidine Place 1 drop into both eyes daily.   azithromycin 500 MG tablet Commonly known as:  ZITHROMAX Take 1 tablet (500 mg total) by mouth daily for 3 days. Take 1 tablet daily for 3 days.   baclofen 20 MG tablet Commonly known as:  LIORESAL Take 20 mg by mouth daily.   butalbital-acetaminophen-caffeine 50-325-40 MG tablet Commonly known as:  FIORICET, ESGIC Take 1 tablet by mouth every 6 (six) hours as needed for headache or migraine.   chlorpheniramine-HYDROcodone 10-8 MG/5ML Suer Commonly known as:  TUSSIONEX SHAKE LQ AND TK 5 ML PO Q 12 H FOR UP TO 7 DAYS PRN PAIN   cholecalciferol 1000 units tablet Commonly known as:  VITAMIN D Take 1,000 Units by mouth daily.   FLUoxetine 20 MG capsule Commonly known as:  PROZAC Take 20 mg by mouth daily.   fluticasone 50 MCG/ACT nasal spray Commonly known as:  FLONASE Place 2 sprays into both nostrils daily. Start taking on:  11/04/2017   ondansetron 8 MG disintegrating tablet Commonly known as:  ZOFRAN-ODT Take 8 mg by mouth every 8 (eight) hours as needed for nausea or vomiting.   polyethylene glycol packet Commonly known as:  MIRALAX / GLYCOLAX Take 17 g by mouth daily. Start taking on:  11/04/2017   PRESERVISION AREDS Caps Take 1 capsule by mouth daily.   senna-docusate 8.6-50 MG tablet Commonly known as:  Senokot-S Take 1 tablet by mouth 2 (two) times daily.   vitamin B-12 1000 MCG tablet Commonly known as:  CYANOCOBALAMIN Take 1,000 mcg by mouth daily.      Allergies  Allergen Reactions  . Erythromycin     GI upset  . Tetracyclines & Related     GI Upset   Follow-up Information    PCP. Schedule an appointment as soon as possible for a visit in 1 week(s).   Why:  f/u in 1-2 weeks.           The results  of significant diagnostics from this hospitalization (including imaging, microbiology, ancillary and laboratory) are listed below for reference.    Significant Diagnostic Studies: Dg Abd 1 View  Result Date: 10/31/2017 CLINICAL DATA:  Vomiting and abdominal pain. EXAM: ABDOMEN - 1 VIEW COMPARISON:  Lumbar spine x-rays dated May 23, 2017. FINDINGS: The bowel gas pattern is normal. Mild colonic stool burden. No radio-opaque calculi or other significant radiographic abnormality are seen. Probable phlebolith in the right pelvis, unchanged. IMPRESSION: Negative. Electronically Signed   By: Obie Dredge M.D.   On: 10/31/2017 20:45    Microbiology: No results found for this or any previous visit (from the past 240 hour(s)).   Labs: Basic Metabolic Panel: Recent Labs  Lab 10/31/17  1508 11/01/17 0552 11/02/17 0600 11/03/17 0549  NA 129* 141 142 143  K 3.5 3.7 3.4* 3.7  CL 94* 109 109 107  CO2 26 26 27 28   GLUCOSE 114* 106* 99 97  BUN 11 8 5* 5*  CREATININE 0.50 0.55 0.54 0.63  CALCIUM 8.5* 8.3* 8.1* 8.6*  MG  --   --  1.8 1.9  PHOS  --   --  2.7  --    Liver Function Tests: Recent Labs  Lab 10/31/17 1508 11/02/17 0600  AST 23 16  ALT 29 19  ALKPHOS 95 75  BILITOT 0.6 0.5  PROT 6.8 5.8*  ALBUMIN 3.5 2.9*   Recent Labs  Lab 10/31/17 1508  LIPASE 21   No results for input(s): AMMONIA in the last 168 hours. CBC: Recent Labs  Lab 10/31/17 1508 11/01/17 0552 11/02/17 0600 11/03/17 0549  WBC 16.5* 11.9* 9.6 10.4  NEUTROABS  --   --  4.5  --   HGB 12.6 12.4 11.3* 12.4  HCT 38.2 38.5 35.4* 38.4  MCV 81.8 83.2 84.5 83.3  PLT 364 328 320 335   Cardiac Enzymes: No results for input(s): CKTOTAL, CKMB, CKMBINDEX, TROPONINI in the last 168 hours. BNP: BNP (last 3 results) No results for input(s): BNP in the last 8760 hours.  ProBNP (last 3 results) No results for input(s): PROBNP in the last 8760 hours.  CBG: No results for input(s): GLUCAP in the last 168  hours.     Signed:  Ramiro Harvestaniel Comer Devins MD.  [facility] 11/03/2017, 4:27 PM

## 2017-11-03 NOTE — Progress Notes (Signed)
Patient has discharged to home on 11/03/17. Discharge instruction including medication and appointment was given to patient. No question at this time.

## 2018-09-10 ENCOUNTER — Encounter (HOSPITAL_COMMUNITY): Payer: Self-pay

## 2018-09-10 ENCOUNTER — Emergency Department (HOSPITAL_COMMUNITY)
Admission: EM | Admit: 2018-09-10 | Discharge: 2018-09-11 | Disposition: A | Discharge status: Home / Self Care | Payer: Federal, State, Local not specified - PPO | Attending: Emergency Medicine | Admitting: Emergency Medicine

## 2018-09-10 ENCOUNTER — Other Ambulatory Visit: Payer: Self-pay

## 2018-09-10 DIAGNOSIS — R519 Headache, unspecified: Secondary | ICD-10-CM

## 2018-09-10 DIAGNOSIS — R51 Headache: Secondary | ICD-10-CM | POA: Diagnosis not present

## 2018-09-10 DIAGNOSIS — R112 Nausea with vomiting, unspecified: Secondary | ICD-10-CM | POA: Diagnosis not present

## 2018-09-10 DIAGNOSIS — F17200 Nicotine dependence, unspecified, uncomplicated: Secondary | ICD-10-CM | POA: Insufficient documentation

## 2018-09-10 DIAGNOSIS — Z79899 Other long term (current) drug therapy: Secondary | ICD-10-CM | POA: Insufficient documentation

## 2018-09-10 LAB — COMPREHENSIVE METABOLIC PANEL
ALK PHOS: 99 U/L (ref 38–126)
ALT: 36 U/L (ref 0–44)
AST: 23 U/L (ref 15–41)
Albumin: 3.8 g/dL (ref 3.5–5.0)
Anion gap: 9 (ref 5–15)
BUN: 15 mg/dL (ref 6–20)
CALCIUM: 8.7 mg/dL — AB (ref 8.9–10.3)
CO2: 26 mmol/L (ref 22–32)
CREATININE: 0.55 mg/dL (ref 0.44–1.00)
Chloride: 102 mmol/L (ref 98–111)
Glucose, Bld: 130 mg/dL — ABNORMAL HIGH (ref 70–99)
Potassium: 3.9 mmol/L (ref 3.5–5.1)
SODIUM: 137 mmol/L (ref 135–145)
Total Bilirubin: 0.6 mg/dL (ref 0.3–1.2)
Total Protein: 7.2 g/dL (ref 6.5–8.1)

## 2018-09-10 LAB — URINALYSIS, ROUTINE W REFLEX MICROSCOPIC
BILIRUBIN URINE: NEGATIVE
GLUCOSE, UA: NEGATIVE mg/dL
HGB URINE DIPSTICK: NEGATIVE
Ketones, ur: NEGATIVE mg/dL
NITRITE: NEGATIVE
PH: 5 (ref 5.0–8.0)
Protein, ur: NEGATIVE mg/dL
SPECIFIC GRAVITY, URINE: 1.014 (ref 1.005–1.030)

## 2018-09-10 LAB — CBC
HCT: 39.7 % (ref 36.0–46.0)
Hemoglobin: 12.5 g/dL (ref 12.0–15.0)
MCH: 26.5 pg (ref 26.0–34.0)
MCHC: 31.5 g/dL (ref 30.0–36.0)
MCV: 84.3 fL (ref 80.0–100.0)
NRBC: 0 % (ref 0.0–0.2)
PLATELETS: 367 10*3/uL (ref 150–400)
RBC: 4.71 MIL/uL (ref 3.87–5.11)
RDW: 13.2 % (ref 11.5–15.5)
WBC: 14.2 10*3/uL — AB (ref 4.0–10.5)

## 2018-09-10 LAB — LIPASE, BLOOD: LIPASE: 25 U/L (ref 11–51)

## 2018-09-10 LAB — I-STAT BETA HCG BLOOD, ED (MC, WL, AP ONLY)

## 2018-09-10 MED ORDER — SODIUM CHLORIDE 0.9% FLUSH
3.0000 mL | Freq: Once | INTRAVENOUS | Status: AC
  Administered 2018-09-11: 3 mL via INTRAVENOUS

## 2018-09-10 NOTE — ED Triage Notes (Signed)
Pt states headache and pain from emesis. Pt states emesis since this morning. Pt states headache x 3 days. Pt states 6 episodes of emesis today. Pt states she took zofran today but helped for <4 hours. Pt states she has taken 20 days of abx and steroids for URI.

## 2018-09-10 NOTE — ED Notes (Signed)
Pt stated she has vomited a total of 10 or more times today, once in the waiting room. And been having a severe headache

## 2018-09-11 ENCOUNTER — Emergency Department (HOSPITAL_COMMUNITY): Payer: Federal, State, Local not specified - PPO

## 2018-09-11 MED ORDER — PROCHLORPERAZINE EDISYLATE 10 MG/2ML IJ SOLN
10.0000 mg | Freq: Once | INTRAMUSCULAR | Status: AC
  Administered 2018-09-11: 10 mg via INTRAVENOUS
  Filled 2018-09-11: qty 2

## 2018-09-11 MED ORDER — DIPHENHYDRAMINE HCL 50 MG/ML IJ SOLN
25.0000 mg | Freq: Once | INTRAMUSCULAR | Status: AC
  Administered 2018-09-11: 25 mg via INTRAVENOUS
  Filled 2018-09-11: qty 1

## 2018-09-11 MED ORDER — METOCLOPRAMIDE HCL 10 MG PO TABS: 10.0000 mg | ORAL_TABLET | Freq: Four times a day (QID) | ORAL | 0 refills | Status: DC | PRN

## 2018-09-11 MED ORDER — KETOROLAC TROMETHAMINE 60 MG/2ML IM SOLN
30.0000 mg | Freq: Once | INTRAMUSCULAR | Status: DC
  Filled 2018-09-11: qty 2

## 2018-09-11 MED ORDER — PROMETHAZINE HCL 25 MG RE SUPP: 25.0000 mg | Freq: Four times a day (QID) | RECTAL | 0 refills | Status: DC | PRN

## 2018-09-11 MED ORDER — SODIUM CHLORIDE 0.9 % IV BOLUS
1000.0000 mL | Freq: Once | INTRAVENOUS | Status: AC
  Administered 2018-09-11: 1000 mL via INTRAVENOUS

## 2018-09-11 MED ORDER — METOCLOPRAMIDE HCL 5 MG/ML IJ SOLN
10.0000 mg | Freq: Once | INTRAMUSCULAR | Status: AC
  Administered 2018-09-11: 10 mg via INTRAVENOUS
  Filled 2018-09-11: qty 2

## 2018-09-11 MED ORDER — KETOROLAC TROMETHAMINE 30 MG/ML IJ SOLN
30.0000 mg | Freq: Once | INTRAMUSCULAR | Status: AC
  Administered 2018-09-11: 30 mg via INTRAVENOUS
  Filled 2018-09-11: qty 1

## 2018-09-11 NOTE — Discharge Instructions (Addendum)
Thank you for allowing me to care for you today in the Emergency Department.   Take 600 mg of ibuprofen or 650 mg of Tylenol once every 6 hours for headache.  For nausea and vomiting, you can take 1 tablet of Reglan every 6 hours or if you continue to have vomiting you can use a 1 promethazine suppository every 6 hours.  Continue to drink plenty of fluids to avoid dehydration.  Your labs were reassuring.  I sent your urine for culture.  If positive, someone from the hospital will call you.  Return to the emergency department if you develop persistent vomiting despite Reglan or Phenergan, neck stiffness, a sudden onset thunderclap headache, vomiting with severe chest pain and/or shortness of breath and sweating, or other new, concerning symptoms.

## 2018-09-11 NOTE — ED Provider Notes (Signed)
[facility]-EMERGENCY DEPT Provider Note   CSN: [id number] Arrival date & time: 09/10/18  1655     History   Chief Complaint Chief Complaint  Patient presents with  . Emesis  . Headache    HPI [name] is a 63 y.o. female with a history of glaucoma and chronic sinusitis who presents to the emergency department with a chief complaint of vomiting.  The patient endorses greater than 10 episodes of nonbloody, nonbilious vomiting since this morning.   The patient returned from a [place] ago.  No other sick contacts from the cruise.  She has had no diarrhea, abdominal pain.  She did not eat any food off of the cruise ship.  She reports that she participated in numerous snorkeling expeditions while on the cruise.  She reports that over the last 2 months she has been having a sore throat and nasal congestion.  She has been treated with 2 Medrol Dosepaks and Omnicef followed by a second course of antibiotics.  She reports that she has been on numerous courses of antibiotics over the last year for bronchitis, ear infections, and sinus infections.  She reports she is also had a bilateral frontal headache over the last few days and intermittently it will wrap around to the base of her skull.  She reports a history of similar headaches.  She also reports that she has previously been seen for headache that when it increases in severity causes of vomiting.  She was seen at urgent care earlier in the day and was given Zofran for vomiting.  She reports that she took 1 dose at home and woke up a couple of hours later with persistent vomiting so she came to the ER for further evaluation.  She denies visual changes, dizziness, lightheadedness, tinnitus, otalgia, diplopia, blurred vision, chest pain, diaphoresis, shortness of breath, lower extremity swelling, sore throat.   The history is provided by the patient. No language interpreter was used.    Emesis  Associated symptoms: cough and headaches   Associated symptoms: no abdominal pain, no chills, no diarrhea, no fever, no myalgias and no sore throat   Headache  Associated symptoms: congestion, cough, dizziness, sinus pressure and vomiting   Associated symptoms: no abdominal pain, no back pain, no diarrhea, no drainage, no ear pain, no fever, no hearing loss, no myalgias, no nausea, no neck stiffness, no numbness, no photophobia, no seizures, no sore throat and no weakness     Past Medical History:  Diagnosis Date  . Depression   . Glaucoma     Patient Active Problem List   Diagnosis Date Noted  . Constipation 11/02/2017  . Dehydration 11/02/2017  . Depression 11/02/2017  . Sinusitis, acute 11/02/2017  . Headache 11/02/2017  . Hyponatremia 11/02/2017  . Leukocytosis 11/02/2017  . Glaucoma 11/02/2017  . Nausea and vomiting 10/31/2017    Past Surgical History:  Procedure Laterality Date  . bladdder sling    . CARPAL TUNNEL RELEASE Right   . EYE SURGERY    . HERNIA REPAIR       OB History   No obstetric history on file.      Home Medications    Prior to Admission medications   Medication Sig Start Date End Date Taking? Authorizing Provider  ALPHAGAN P 0.1 % SOLN Place 1 drop into both eyes daily.  08/12/17  Yes [provider]  baclofen (LIORESAL) 20 MG tablet Take 20 mg by mouth  daily as needed for muscle spasms.  10/17/17  Yes [provider]  cholecalciferol (VITAMIN D) 1000 units tablet Take 1,000 Units by mouth daily.   Yes [provider]  FLUoxetine (PROZAC) 20 MG capsule Take 20 mg by mouth daily.  10/30/17  Yes [provider]  LUMIGAN 0.01 % SOLN Place 1 drop into both eyes 2 (two) times daily. 08/04/18  Yes [provider]  Multiple Vitamins-Minerals (PRESERVISION AREDS) CAPS Take 1 capsule by mouth daily after breakfast.    Yes [provider]  ondansetron (ZOFRAN-ODT) 8 MG disintegrating tablet Take  8 mg by mouth every 8 (eight) hours as needed for nausea or vomiting.  10/31/17  Yes [provider]  vitamin B-12 (CYANOCOBALAMIN) 1000 MCG tablet Take 1,000 mcg by mouth daily.   Yes [provider]  azithromycin (ZITHROMAX) 250 MG tablet Take 250-500 mg by mouth See admin instructions. 500 mg now, 250 mg x 4 days until gone 09/10/18   [provider]  metoCLOPramide (REGLAN) 10 MG tablet Take 1 tablet (10 mg total) by mouth every 6 (six) hours as needed for nausea. 09/11/18   Farran Amsden A, PA-C  promethazine (PHENERGAN) 25 MG suppository Place 1 suppository (25 mg total) rectally every 6 (six) hours as needed for nausea or vomiting. 09/11/18   Viyaan Champine A, PA-C    Family History Family History  Problem Relation Age of Onset  . Heart failure Mother   . Glaucoma Mother   . Diabetes Mother   . Atrial fibrillation Mother     Social History Social History   Tobacco Use  . Smoking status: Current Every Day Smoker  . Smokeless tobacco: Current User  Substance Use Topics  . Alcohol use: Not Currently  . Drug use: Never     Allergies   Augmentin [amoxicillin-pot clavulanate]; Erythromycin; and Tetracyclines & related   Review of Systems Review of Systems  Constitutional: Negative for activity change, chills and fever.  HENT: Positive for congestion, sinus pressure and sinus pain. Negative for drooling, ear pain, hearing loss, postnasal drip and sore throat.   Eyes: Negative for photophobia and visual disturbance.  Respiratory: Positive for cough. Negative for shortness of breath and wheezing.   Cardiovascular: Negative for chest pain, palpitations and leg swelling.  Gastrointestinal: Positive for vomiting. Negative for abdominal pain, constipation, diarrhea and nausea.  Genitourinary: Negative for dysuria.  Musculoskeletal: Negative for back pain, gait problem, myalgias and neck stiffness.  Skin: Negative for rash.  Allergic/Immunologic: Negative for  immunocompromised state.  Neurological: Positive for dizziness, light-headedness and headaches. Negative for seizures, syncope, weakness and numbness.  Psychiatric/Behavioral: Negative for confusion.   Physical Exam Updated Vital Signs BP (!) 125/91   Pulse 70   Temp 98.4 F (36.9 C) (Oral)   Resp 18   Ht 5\' 4"  (1.626 m)   Wt 90.1 kg   SpO2 95%   BMI 34.11 kg/m   Physical Exam Vitals signs and nursing note reviewed.  Constitutional:      General: She is not in acute distress.    Appearance: She is not ill-appearing, toxic-appearing or diaphoretic.  HENT:     Head: Normocephalic.     Comments: Debris noted behind the right TM.  Left TM is unremarkable.  Tenderness over the bilateral frontal sinuses.  No tenderness over the bilateral maxillary sinuses or mastoid air cells.    Nose: Nose normal.     Right Sinus: No maxillary sinus tenderness or frontal sinus tenderness.  Left Sinus: No maxillary sinus tenderness or frontal sinus tenderness.     Mouth/Throat:     Pharynx: Oropharynx is clear. Uvula midline. No pharyngeal swelling, oropharyngeal exudate, posterior oropharyngeal erythema or uvula swelling.  Eyes:     Extraocular Movements: Extraocular movements intact.     Conjunctiva/sclera: Conjunctivae normal.     Pupils: Pupils are equal, round, and reactive to light.  Neck:     Musculoskeletal: Neck supple.  Cardiovascular:     Rate and Rhythm: Normal rate and regular rhythm.     Heart sounds: No murmur. No friction rub. No gallop.   Pulmonary:     Effort: Pulmonary effort is normal. No respiratory distress.     Breath sounds: No stridor. No wheezing, rhonchi or rales.  Abdominal:     General: There is no distension.     Palpations: Abdomen is soft. There is no mass.     Tenderness: There is no abdominal tenderness. There is no guarding.     Comments: Abdomen is soft, nontender, nondistended.  Skin:    General: Skin is warm.     Findings: No rash.  Neurological:      Mental Status: She is alert.     Comments: Moves all 4 extremities.  Sensations intact to the bilateral upper and lower extremities.  Cranial nerves 2 through 12 are grossly intact.  Psychiatric:        Behavior: Behavior normal.      ED Treatments / Results  Labs (all labs ordered are listed, but only abnormal results are displayed) Labs Reviewed  COMPREHENSIVE METABOLIC PANEL - Abnormal; Notable for the following components:      Result Value   Glucose, Bld 130 (*)    Calcium 8.7 (*)    All other components within normal limits  CBC - Abnormal; Notable for the following components:   WBC 14.2 (*)    All other components within normal limits  URINALYSIS, ROUTINE W REFLEX MICROSCOPIC - Abnormal; Notable for the following components:   APPearance HAZY (*)    Leukocytes, UA SMALL (*)    Bacteria, UA RARE (*)    All other components within normal limits  URINE CULTURE  LIPASE, BLOOD  I-STAT BETA HCG BLOOD, ED ([facility], [facility], [facility] ONLY)    EKG None  Radiology Dg Chest 2 View  Result Date: 09/11/2018 CLINICAL DATA:  Productive cough and vomiting EXAM: CHEST - 2 VIEW COMPARISON:  11/07/2017 FINDINGS: Normal heart size and mediastinal contours. No acute infiltrate or edema. No effusion or pneumothorax. No acute osseous findings. IMPRESSION: Negative chest. Electronically Signed   By: Marnee Spring M.D.   On: 09/11/2018 05:29   Ct Head Wo Contrast  Result Date: 09/11/2018 CLINICAL DATA:  Acute headache with normal neuro exam EXAM: CT HEAD WITHOUT CONTRAST TECHNIQUE: Contiguous axial images were obtained from the base of the skull through the vertex without intravenous contrast. COMPARISON:  None. FINDINGS: Brain: No evidence of acute infarction, hemorrhage, hydrocephalus, extra-axial collection or mass lesion/mass effect. Vascular: No hyperdense vessel or unexpected calcification. Skull: Normal. Negative for fracture or focal lesion. Sinuses/Orbits: Negative IMPRESSION: Negative head CT  Electronically Signed   By: Marnee Spring M.D.   On: 09/11/2018 05:14    Procedures Procedures (including critical care time)  Medications Ordered in ED Medications  sodium chloride flush (NS) 0.9 % injection 3 mL (3 mLs Intravenous Given 09/11/18 0336)  metoCLOPramide (REGLAN) injection 10 mg (10 mg Intravenous Given 09/11/18 0328)  sodium chloride 0.9 %  bolus 1,000 mL (0 mLs Intravenous Stopped 09/11/18 0656)  prochlorperazine (COMPAZINE) injection 10 mg (10 mg Intravenous Given 09/11/18 0636)  diphenhydrAMINE (BENADRYL) injection 25 mg (25 mg Intravenous Given 09/11/18 0636)  ketorolac (TORADOL) 30 MG/ML injection 30 mg (30 mg Intravenous Given 09/11/18 0656)     Initial Impression / Assessment and Plan / ED Course  I have reviewed the triage vital signs and the nursing notes.  Pertinent labs & imaging results that were available during my care of the patient were reviewed by me and considered in my medical decision making (see chart for details).     63 year old female with a history of glaucoma and chronic sinusitis presenting with vomiting, onset less than 24 hours ago.  She did recently returned home from a cruise from the [place] ago.  She did not eat or drink off of the cruise ship and none of her traveling companions have been ill.  There is no concern for undercooked food.  She is also been having a headache.  Per the patient, she has had a history of similar constellations of the symptoms previously and when her headache is gotten more severe it is cause vomiting.  Labs today are reassuring.  She has a leukocytosis of 14.2, but I suspect this is secondary to the 2 Medrol Dosepaks that she is completed over the last few weeks.  I have a low suspicion for intra-abdominal pathology, ACS, or central vertigo.  Low suspicion for labyrinthitis.  I suspect her vomiting could be secondary her headache that she has previously had similar etiology.  She was given a migraine  cocktail of Toradol, Compazine, Benadryl, and IV fluids.  On reevaluation, her headache has markedly improved.  She was successfully fluid challenge.  At this time, she is hemodynamically stable and in no acute distress.  Will discharge home with antibiotics and recommended PCP follow-up for recheck of her symptoms.  Strict return precautions given.  Final Clinical Impressions(s) / ED Diagnoses   Final diagnoses:  Non-intractable vomiting with nausea, unspecified vomiting type  Bad headache    ED Discharge Orders         Ordered    metoCLOPramide (REGLAN) 10 MG tablet  Every 6 hours PRN     09/11/18 0701    promethazine (PHENERGAN) 25 MG suppository  Every 6 hours PRN     09/11/18 0701           Sye Schroepfer, Pedro EarlsMia A, PA-C 09/11/18 [id number]    Dione BoozeGlick, David, MD 09/11/18 2240

## 2018-09-11 NOTE — ED Notes (Signed)
Patient transported to CT 

## 2018-09-13 LAB — URINE CULTURE
Culture: >=100000 — AB
Special Requests: NORMAL

## 2018-09-14 ENCOUNTER — Telehealth: Payer: Self-pay | Admitting: *Deleted

## 2018-09-14 NOTE — Telephone Encounter (Signed)
Post ED Visit - Positive Culture Follow-up  Culture report reviewed by antimicrobial stewardship pharmacist:  []  Enzo Bi, Pharm.D. []  Celedonio Miyamoto, Pharm.D., BCPS AQ-ID []  Garvin Fila, Pharm.D., BCPS []  Georgina Pillion, Pharm.D., BCPS []  [place], [address].D., BCPS, AAHIVP []  Estella Husk, Pharm.D., BCPS, AAHIVP []  Lysle Pearl, PharmD, BCPS []  Phillips Climes, PharmD, BCPS []  Agapito Games, PharmD, BCPS []  Verlan Friends, PharmD Mervyn Gay, PharmD  Positive urine culture Treated with Azithromycin, organism sensitive to the same and no further patient follow-up is required at this time.  Virl [facility] 09/14/2018, 11:40 AM

## 2020-09-30 DIAGNOSIS — Z8616 Personal history of COVID-19: Secondary | ICD-10-CM

## 2020-09-30 HISTORY — DX: Personal history of COVID-19: Z86.16

## 2021-03-02 DEATH — deceased

## 2021-06-12 ENCOUNTER — Emergency Department (HOSPITAL_COMMUNITY): Payer: Federal, State, Local not specified - PPO

## 2021-06-12 ENCOUNTER — Emergency Department (HOSPITAL_COMMUNITY)
Admission: EM | Admit: 2021-06-12 | Discharge: 2021-06-13 | Disposition: A | Discharge status: Home / Self Care | Payer: Federal, State, Local not specified - PPO | Attending: Emergency Medicine | Admitting: Emergency Medicine

## 2021-06-12 ENCOUNTER — Other Ambulatory Visit: Payer: Self-pay

## 2021-06-12 ENCOUNTER — Encounter (HOSPITAL_COMMUNITY): Payer: Self-pay | Admitting: Emergency Medicine

## 2021-06-12 DIAGNOSIS — R112 Nausea with vomiting, unspecified: Secondary | ICD-10-CM | POA: Insufficient documentation

## 2021-06-12 DIAGNOSIS — F17220 Nicotine dependence, chewing tobacco, uncomplicated: Secondary | ICD-10-CM | POA: Diagnosis not present

## 2021-06-12 DIAGNOSIS — R1909 Other intra-abdominal and pelvic swelling, mass and lump: Secondary | ICD-10-CM | POA: Diagnosis not present

## 2021-06-12 DIAGNOSIS — R1032 Left lower quadrant pain: Secondary | ICD-10-CM | POA: Diagnosis not present

## 2021-06-12 DIAGNOSIS — R1900 Intra-abdominal and pelvic swelling, mass and lump, unspecified site: Secondary | ICD-10-CM

## 2021-06-12 LAB — COMPREHENSIVE METABOLIC PANEL
ALT: 19 U/L (ref 0–44)
AST: 16 U/L (ref 15–41)
Albumin: 4.2 g/dL (ref 3.5–5.0)
Alkaline Phosphatase: 95 U/L (ref 38–126)
Anion gap: 11 (ref 5–15)
BUN: 14 mg/dL (ref 8–23)
CO2: 23 mmol/L (ref 22–32)
Calcium: 9.2 mg/dL (ref 8.9–10.3)
Chloride: 102 mmol/L (ref 98–111)
Creatinine, Ser: 0.72 mg/dL (ref 0.44–1.00)
GFR, Estimated: >60 mL/min (ref >60)
Glucose, Bld: 114 mg/dL — ABNORMAL HIGH (ref 70–99)
Potassium: 3.6 mmol/L (ref 3.5–5.1)
Sodium: 136 mmol/L (ref 135–145)
Total Bilirubin: 0.9 mg/dL (ref 0.3–1.2)
Total Protein: 8 g/dL (ref 6.5–8.1)

## 2021-06-12 LAB — CBC WITH DIFFERENTIAL/PLATELET
Abs Immature Granulocytes: 0.11 10*3/uL — ABNORMAL HIGH (ref 0.00–0.07)
Basophils Absolute: 0 10*3/uL (ref 0.0–0.1)
Basophils Relative: 0 %
Eosinophils Absolute: 0 10*3/uL (ref 0.0–0.5)
Eosinophils Relative: 0 %
HCT: 39.8 % (ref 36.0–46.0)
Hemoglobin: 13.4 g/dL (ref 12.0–15.0)
Immature Granulocytes: 1 %
Lymphocytes Relative: 11 %
Lymphs Abs: 2.1 10*3/uL (ref 0.7–4.0)
MCH: 27.2 pg (ref 26.0–34.0)
MCHC: 33.7 g/dL (ref 30.0–36.0)
MCV: 80.7 fL (ref 80.0–100.0)
Monocytes Absolute: 1.4 10*3/uL — ABNORMAL HIGH (ref 0.1–1.0)
Monocytes Relative: 8 %
Neutro Abs: 14.7 10*3/uL — ABNORMAL HIGH (ref 1.7–7.7)
Neutrophils Relative %: 80 %
Platelets: 436 10*3/uL — ABNORMAL HIGH (ref 150–400)
RBC: 4.93 MIL/uL (ref 3.87–5.11)
RDW: 13.2 % (ref 11.5–15.5)
WBC: 18.3 10*3/uL — ABNORMAL HIGH (ref 4.0–10.5)
nRBC: 0 % (ref 0.0–0.2)

## 2021-06-12 LAB — URINALYSIS, ROUTINE W REFLEX MICROSCOPIC
Bilirubin Urine: NEGATIVE
Glucose, UA: NEGATIVE mg/dL
Ketones, ur: 80 mg/dL — AB
Nitrite: NEGATIVE
Protein, ur: NEGATIVE mg/dL
Specific Gravity, Urine: 1.01 (ref 1.005–1.030)
pH: 5 (ref 5.0–8.0)

## 2021-06-12 LAB — LIPASE, BLOOD: Lipase: 22 U/L (ref 11–51)

## 2021-06-12 MED ORDER — SODIUM CHLORIDE 0.9 % IV BOLUS
1000.0000 mL | Freq: Once | INTRAVENOUS | Status: AC
  Administered 2021-06-12: 1000 mL via INTRAVENOUS

## 2021-06-12 MED ORDER — IOHEXOL 350 MG/ML SOLN
80.0000 mL | Freq: Once | INTRAVENOUS | Status: AC | PRN
  Administered 2021-06-12: 80 mL via INTRAVENOUS

## 2021-06-12 MED ORDER — METOCLOPRAMIDE HCL 5 MG/ML IJ SOLN
10.0000 mg | Freq: Once | INTRAMUSCULAR | Status: AC
  Administered 2021-06-12: 10 mg via INTRAVENOUS
  Filled 2021-06-12: qty 2

## 2021-06-12 MED ORDER — MORPHINE SULFATE (PF) 4 MG/ML IV SOLN
4.0000 mg | Freq: Once | INTRAVENOUS | Status: AC
  Administered 2021-06-12: 4 mg via INTRAVENOUS
  Filled 2021-06-12: qty 1

## 2021-06-12 MED ORDER — DIPHENHYDRAMINE HCL 25 MG PO CAPS
25.0000 mg | ORAL_CAPSULE | Freq: Once | ORAL | Status: AC
  Administered 2021-06-12: 25 mg via ORAL
  Filled 2021-06-12: qty 1

## 2021-06-12 NOTE — ED Triage Notes (Signed)
Reports R and L LQ pain since Tuesday, reports emesis and abd pain. Is on abx in case it is diverticulitis. Had loose stool yesterday and today. Clear to brownish emesis.

## 2021-06-12 NOTE — ED Provider Notes (Signed)
[facility] DEPT Provider Note   CSN: [id number] Arrival date & time: 06/12/21  1754     History No chief complaint on file.   [name] is a 65 y.o. female.  HPI Patient is a 65 year old female with past medical history significant for depression and glaucoma  Patient is presented to the emergency department today with complaints of left lower quadrant abdominal pain for the past 4 days.  She states that the pain has been achy constant and severe.  She states that she had eaten some [place] earlier that day and originally ascribed her pain to this.  She states that it is crampy dull and pressure-like occasionally.  She states that her symptoms did have been gradually worsening since onset.  She states that she has been constipated has not had very many bowel movements.  She states she has some improvements with bowel movements.  No blood in stool.  No hematuria frequency urgency or dysuria.  No myalgias or chest pain or shortness of breath.  She states she has had several episodes of nonbloody nonbilious emesis and states that she has been feeling persistently nauseous.  She was started on Cipro Flagyl 11/10.    Past Medical History:  Diagnosis Date   Depression    Glaucoma     Patient Active Problem List   Diagnosis Date Noted   Constipation 11/02/2017   Dehydration 11/02/2017   Depression 11/02/2017   Sinusitis, acute 11/02/2017   Headache 11/02/2017   Hyponatremia 11/02/2017   Leukocytosis 11/02/2017   Glaucoma 11/02/2017   Nausea and vomiting 10/31/2017    Past Surgical History:  Procedure Laterality Date   bladdder sling     CARPAL TUNNEL RELEASE Right    EYE SURGERY     HERNIA REPAIR       OB History   No obstetric history on file.     Family History  Problem Relation Age of Onset   Heart failure Mother    Glaucoma Mother    Diabetes Mother    Atrial fibrillation Mother     Social History   Tobacco Use    Smoking status: Every Day   Smokeless tobacco: Current  Vaping Use   Vaping Use: Never used  Substance Use Topics   Alcohol use: Not Currently   Drug use: Never    Home Medications Prior to Admission medications   Medication Sig Start Date End Date Taking? Authorizing Provider  metoCLOPramide (REGLAN) 10 MG tablet Take 1 tablet (10 mg total) by mouth every 6 (six) hours. 06/13/21  Yes Seniya Stoffers S, PA  ALPHAGAN P 0.1 % SOLN Place 1 drop into both eyes daily.  08/12/17   [provider]  azithromycin (ZITHROMAX) 250 MG tablet Take 250-500 mg by mouth See admin instructions. 500 mg now, 250 mg x 4 days until gone 09/10/18   [provider]  baclofen (LIORESAL) 20 MG tablet Take 20 mg by mouth daily as needed for muscle spasms.  10/17/17   [provider]  cholecalciferol (VITAMIN D) 1000 units tablet Take 1,000 Units by mouth daily.    [provider]  FLUoxetine (PROZAC) 20 MG capsule Take 20 mg by mouth daily.  10/30/17   [provider]  LUMIGAN 0.01 % SOLN Place 1 drop into both eyes 2 (two) times daily. 08/04/18   [provider]  Multiple Vitamins-Minerals (PRESERVISION AREDS) CAPS Take 1 capsule by mouth daily after breakfast.     [provider]  ondansetron (ZOFRAN-ODT) 8 MG disintegrating tablet Take 8 mg by mouth every 8 (eight) hours as needed for nausea or vomiting.  10/31/17   [provider]  promethazine (PHENERGAN) 25 MG suppository Place 1 suppository (25 mg total) rectally every 6 (six) hours as needed for nausea or vomiting. 09/11/18   McDonald, Mia A, PA-C  vitamin B-12 (CYANOCOBALAMIN) 1000 MCG tablet Take 1,000 mcg by mouth daily.    [provider]    Allergies    Augmentin [amoxicillin-pot clavulanate], Erythromycin, and Tetracyclines & related  Review of Systems   Review of Systems  Constitutional:  Negative for chills and fever.  HENT:  Negative for congestion.   Eyes:  Negative for  pain.  Respiratory:  Negative for cough and shortness of breath.   Cardiovascular:  Negative for chest pain and leg swelling.  Gastrointestinal:  Positive for abdominal pain, constipation, nausea and vomiting. Negative for diarrhea.  Genitourinary:  Negative for dysuria.  Musculoskeletal:  Negative for myalgias.  Skin:  Negative for rash.  Neurological:  Negative for dizziness and headaches.   Physical Exam Updated Vital Signs BP 140/74 (BP Location: Right Arm)   Pulse 74   Temp 98.2 F (36.8 C) (Oral)   Resp 18   SpO2 99%   Physical Exam Vitals and nursing note reviewed.  Constitutional:      General: She is not in acute distress. HENT:     Head: Normocephalic and atraumatic.     Nose: Nose normal.     Mouth/Throat:     Mouth: Mucous membranes are moist.  Eyes:     General: No scleral icterus. Cardiovascular:     Rate and Rhythm: Normal rate and regular rhythm.     Pulses: Normal pulses.     Heart sounds: Normal heart sounds.  Pulmonary:     Effort: Pulmonary effort is normal. No respiratory distress.     Breath sounds: No wheezing.  Abdominal:     Palpations: Abdomen is soft.     Tenderness: There is abdominal tenderness.     Comments: Diffuse lower abdominal tenderness.  Seems to be worse in the left lower quadrant there is no guarding or rebound tenderness.  Patient is not peritonitic.  Abdomen is soft.  No bruising.  No CVA tenderness.  Musculoskeletal:     Cervical back: Normal range of motion.     Right lower leg: No edema.     Left lower leg: No edema.  Skin:    General: Skin is warm and dry.     Capillary Refill: Capillary refill takes less than 2 seconds.  Neurological:     Mental Status: She is alert. Mental status is at baseline.  Psychiatric:        Mood and Affect: Mood normal.        Behavior: Behavior normal.    ED Results / Procedures / Treatments   Labs (all labs ordered are listed, but only abnormal results are displayed) Labs Reviewed  CBC  WITH DIFFERENTIAL/PLATELET - Abnormal; Notable for the following components:      Result Value   WBC 18.3 (*)    Platelets 436 (*)    Neutro Abs 14.7 (*)    Monocytes Absolute 1.4 (*)    Abs Immature Granulocytes 0.11 (*)    All other components within normal limits  COMPREHENSIVE METABOLIC PANEL - Abnormal; Notable for the following components:   Glucose, Bld 114 (*)    All other components within normal limits  URINALYSIS, ROUTINE W REFLEX MICROSCOPIC - Abnormal; Notable for the following components:   Hgb urine dipstick SMALL (*)    Ketones, ur 80 (*)    Leukocytes,Ua SMALL (*)    Bacteria, UA RARE (*)    All other components within normal limits  LIPASE, BLOOD    EKG None  Radiology US Transvaginal Non-OB  Result Date: 06/13/2021 CLINICAL DATA:  Left lower quadrant abdominal pain, pelvic cyst EXAM: TRANSABDOMINAL AND TRANSVAGINAL ULTRASOUND OF PELVIS DOPPLER ULTRASOUND OF OVARIES TECHNIQUE: Both transabdominal and transvaginal ultrasound examinations of the pelvis were performed. Transabdominal technique was performed for global imaging of the pelvis including uterus, ovaries, adnexal regions, and pelvic cul-de-sac. It was necessary to proceed with endovaginal exam following the transabdominal exam to visualize the pelvic cyst. Color and duplex Doppler ultrasound was utilized to evaluate blood flow to the ovaries. COMPARISON:  CT 8:48 p.m., MRI 06/17/2017 FINDINGS: Uterus Absent Endometrium Not applicable Right ovary Not visualized Left ovary Not visualized. Pulsed Doppler evaluation of both ovaries demonstrates normal low-resistance arterial and venous waveforms. Other findings Within the mid by pelvis is a minimally complex cyst demonstrating a single thin noncalcified internal septation measuring 8.6 x 7.2 x 9.2 cm which is enlarged since prior examination. No associated calcifications, mural nodularity, or asymmetric wall thickening. There is mild, preserved peripheral arterial and  venous vascularity identified. No free fluid within the pelvis. IMPRESSION: Status post hysterectomy. The ovaries are not visualized, unchanged from prior examination. Unilocular minimally complex cystic lesion within the pelvis demonstrating interval increase in size since remote prior MRI examination of 06/17/2017 now measuring 9.2 cm in greatest dimension. Given its interval increase in size, a low-grade cystic neoplasm should be considered. Gynecologic consultation may be helpful for further management. Electronically Signed   By: Helyn NumbersAshesh  Parikh M.D.   On: 06/13/2021 01:07   US Pelvis Complete  Result Date: 06/13/2021 CLINICAL DATA:  Left lower quadrant abdominal pain, pelvic cyst EXAM: TRANSABDOMINAL AND TRANSVAGINAL ULTRASOUND OF PELVIS DOPPLER ULTRASOUND OF OVARIES TECHNIQUE: Both transabdominal and transvaginal ultrasound examinations of the pelvis were performed. Transabdominal technique was performed for global imaging of the pelvis including uterus, ovaries, adnexal regions, and pelvic cul-de-sac. It was necessary to proceed with endovaginal exam following the transabdominal exam to visualize the pelvic cyst. Color and duplex Doppler ultrasound was utilized to evaluate blood flow to the ovaries. COMPARISON:  CT 8:48 p.m., MRI 06/17/2017 FINDINGS: Uterus Absent Endometrium Not applicable Right ovary Not visualized Left ovary Not visualized. Pulsed Doppler evaluation of both ovaries demonstrates normal low-resistance arterial and venous waveforms. Other findings Within the mid by pelvis is a minimally complex cyst demonstrating a single thin noncalcified internal septation measuring 8.6 x 7.2 x 9.2 cm which is enlarged since prior examination. No associated calcifications, mural nodularity, or asymmetric wall thickening. There is mild, preserved peripheral arterial and venous vascularity identified. No free fluid within the pelvis. IMPRESSION: Status post hysterectomy. The ovaries are not visualized,  unchanged from prior examination. Unilocular minimally complex cystic lesion within the pelvis demonstrating interval increase in size since remote prior MRI examination of 06/17/2017 now measuring 9.2 cm in greatest dimension. Given its interval increase in size, a low-grade cystic neoplasm should be considered. Gynecologic consultation may be helpful for further management. Electronically Signed   By: Helyn NumbersAshesh  Parikh M.D.   On: 06/13/2021 01:07   CT ABDOMEN PELVIS W CONTRAST  Result Date: 06/12/2021 CLINICAL DATA:  Left lower quadrant abdominal pain. EXAM: CT ABDOMEN AND PELVIS WITH CONTRAST TECHNIQUE: Multidetector  CT imaging of the abdomen and pelvis was performed using the standard protocol following bolus administration of intravenous contrast. CONTRAST:  47mL OMNIPAQUE IOHEXOL 350 MG/ML SOLN COMPARISON:  None. FINDINGS: Lower chest: There is mild atelectasis or scarring in the lingular segment of the left upper lobe. Hepatobiliary: No focal liver abnormality is seen. No gallstones, gallbladder wall thickening, or biliary dilatation. Pancreas: Unremarkable. No pancreatic ductal dilatation or surrounding inflammatory changes. Spleen: Normal in size without focal abnormality. Adrenals/Urinary Tract: Adrenal glands are unremarkable. Kidneys are normal, without renal calculi, focal lesion, or hydronephrosis. Bladder is unremarkable. Stomach/Bowel: No bowel obstruction, free air or pneumatosis. A normal appendix is seen in the right lower quadrant. There is no focal bowel wall thickening. Vascular/Lymphatic: There is atherosclerotic calcification of the aorta without evidence of aneurysm. No abdominal or pelvic lymphadenopathy is seen. Reproductive: The uterus is not definitely visualized on exam. There is a large cystic attenuation structure in the pelvis in the midline measuring 8.6 x 7.3 x 7.7 cm and may be ovarian in origin. Mild fat stranding is present in the pelvis adjacent to this lesion. Other: No free  fluid. Musculoskeletal: Degenerative changes are present in the thoracolumbar spine. There is sclerosis at the sacroiliac joints bilaterally, compatible with sacroiliitis. IMPRESSION: 1. No acute intra-abdominal process. 2. Large cystic structure in the midline in the pelvis which may be ovarian in origin. The lesion measures 8.6 x 7.3 x 7.7 cm and may be ovarian in etiology. There is mild surrounding fat stranding. The possibility of associated infectious or inflammatory process cannot be excluded. Ultrasound is recommended for further evaluation. Electronically Signed   By: Brett Fairy M.D.   On: 06/12/2021 21:26   [place] Art/Ven Flow Abd Pelv Doppler  Result Date: 06/13/2021 CLINICAL DATA:  Left lower quadrant abdominal pain, pelvic cyst EXAM: TRANSABDOMINAL AND TRANSVAGINAL ULTRASOUND OF PELVIS DOPPLER ULTRASOUND OF OVARIES TECHNIQUE: Both transabdominal and transvaginal ultrasound examinations of the pelvis were performed. Transabdominal technique was performed for global imaging of the pelvis including uterus, ovaries, adnexal regions, and pelvic cul-de-sac. It was necessary to proceed with endovaginal exam following the transabdominal exam to visualize the pelvic cyst. Color and duplex Doppler ultrasound was utilized to evaluate blood flow to the ovaries. COMPARISON:  CT 8:48 p.m., MRI 06/17/2017 FINDINGS: Uterus Absent Endometrium Not applicable Right ovary Not visualized Left ovary Not visualized. Pulsed Doppler evaluation of both ovaries demonstrates normal low-resistance arterial and venous waveforms. Other findings Within the mid by pelvis is a minimally complex cyst demonstrating a single thin noncalcified internal septation measuring 8.6 x 7.2 x 9.2 cm which is enlarged since prior examination. No associated calcifications, mural nodularity, or asymmetric wall thickening. There is mild, preserved peripheral arterial and venous vascularity identified. No free fluid within the pelvis. IMPRESSION:  Status post hysterectomy. The ovaries are not visualized, unchanged from prior examination. Unilocular minimally complex cystic lesion within the pelvis demonstrating interval increase in size since remote prior MRI examination of 06/17/2017 now measuring 9.2 cm in greatest dimension. Given its interval increase in size, a low-grade cystic neoplasm should be considered. Gynecologic consultation may be helpful for further management. Electronically Signed   By: Fidela Salisbury M.D.   On: 06/13/2021 01:07    Procedures Procedures   Medications Ordered in ED Medications  iohexol (OMNIPAQUE) 350 MG/ML injection 80 mL (80 mLs Intravenous Contrast Given 06/12/21 2055)  sodium chloride 0.9 % bolus 1,000 mL (0 mLs Intravenous Stopped 06/13/21 0002)  metoCLOPramide (REGLAN) injection 10 mg (10 mg Intravenous  Given 06/12/21 2248)  diphenhydrAMINE (BENADRYL) capsule 25 mg (25 mg Oral Given 06/12/21 2248)  morphine 4 MG/ML injection 4 mg (4 mg Intravenous Given 06/12/21 2248)  ketorolac (TORADOL) 30 MG/ML injection 30 mg (30 mg Intravenous Given 06/13/21 0144)    ED Course  I have reviewed the triage vital signs and the nursing notes.  Pertinent labs & imaging results that were available during my care of the patient were reviewed by me and considered in my medical decision making (see chart for details).  Clinical Course as of 06/13/21 0156  Sat Jun 13, 2021  0152 Discussed with Jeral Pinch MD of GYN onc who feels unlikely to be malignant tumor given the slow growth over the past 4 years from 2018 MRI however given that symptoms are now being experienced.  She will follow up with OBGYN per [facility] instructions. Pt has hydrocodone at home from PCP.  Pain is well controlled at this time.  We will provide patient with 1 dose of Toradol to go home with and instructions for close follow-up. [WF]    Clinical Course User Index [WF] Tedd Sias, [place]   MDM Rules/Calculators/A&P                           Patient is a 65 year old female presented to the emergency room today with complaints of left lower quad abdominal pain/pelvic pain is been ongoing for the past 4 days  Being treated empirically for diverticulitis by PCP/UC.  Exam notable for lower abdominal tenderness no guarding or rebound.  CT scan shows approximately 9 cm area pelvic mass with some inflammatory stranding surrounding.  Ultrasound obtained negative for torsion.  Discussed with my attending physician who recommended gynecologic consultation --Jeral Pinch, MD--who recommended outpatient follow-up  Patient has analgesia in the form of hydrocodone from PCP.  She will take this as well as Tylenol ibuprofen as discussed specifics of dosing were delineated for patient.  Given Reglan for nausea.  Tolerated PO without difficulty. No pain. No nausea.   GIANAH HUDLEY was evaluated in Emergency Department on 06/13/2021 for the symptoms described in the history of present illness. She was evaluated in the context of the global COVID-19 pandemic, which necessitated consideration that the patient might be at risk for infection with the SARS-CoV-2 virus that causes COVID-19. Institutional protocols and algorithms that pertain to the evaluation of patients at risk for COVID-19 are in a state of rapid change based on information released by regulatory bodies including the CDC and federal and state organizations. These policies and algorithms were followed during the patient's care in the ED.   Final Clinical Impression(s) / ED Diagnoses Final diagnoses:  Pelvic mass  Left lower quadrant abdominal pain    Rx / DC Orders ED Discharge Orders          Ordered    metoCLOPramide (REGLAN) 10 MG tablet  Every 6 hours        06/13/21 0155             Tedd Sias, [place] 06/13/21 [id number]    Randal Buba, April, MD 06/13/21 0532

## 2021-06-12 NOTE — ED Notes (Signed)
Pt care taken, still hurting on the left lower abdomen.

## 2021-06-12 NOTE — ED Provider Notes (Signed)
Emergency Medicine Provider Triage Evaluation Note  [name] , a 65 y.o. female  was evaluated in triage.  Pt complains of left lower abdominal pain.  Pt is being treated for diverticulitis.  Pt is unable to keep antibiotics down   Review of Systems  Positive: Nausea and vomiting Negative: diarrhea  Physical Exam  BP 139/82 (BP Location: Left Arm)   Pulse 90   Temp 98.4 F (36.9 C) (Oral)   Resp 17   SpO2 99%  Gen:   Awake, no distress   Resp:  Normal effort  MSK:   Moves extremities without difficulty  Other:  Tender left lower abdomen  Medical Decision Making  Medically screening exam initiated at 6:50 PM.  Appropriate orders placed.  [name] was informed that the remainder of the evaluation will be completed by another provider, this initial triage assessment does not replace that evaluation, and the importance of remaining in the ED until their evaluation is complete.     [name], [place] 06/12/21 [name]    Patricia Laine, MD 06/15/21 [phone]

## 2021-06-13 MED ORDER — KETOROLAC TROMETHAMINE 30 MG/ML IJ SOLN
30.0000 mg | Freq: Once | INTRAMUSCULAR | Status: AC
  Administered 2021-06-13: 30 mg via INTRAVENOUS
  Filled 2021-06-13: qty 1

## 2021-06-13 MED ORDER — METOCLOPRAMIDE HCL 10 MG PO TABS: 10.0000 mg | ORAL_TABLET | Freq: Four times a day (QID) | ORAL | 0 refills | Status: AC

## 2021-06-13 NOTE — Discharge Instructions (Addendum)
Please follow-up with your OB/GYN.  Please follow-up expediently since you have been experiencing symptoms.  I have prescribed you some Reglan which is a different nausea medicine you may use this for nausea if you are vomiting you may use Zofran which is an under the tongue medication. Take reglan w 25 mg of benadryl.   Please use Tylenol or ibuprofen for pain.  You may use 600 mg ibuprofen every 6 hours or 1000 mg of Tylenol every 6 hours.  You may choose to alternate between the 2.  This would be most effective.  Not to exceed 4 g of Tylenol within 24 hours.  Not to exceed 3200 mg ibuprofen 24 hours.   I recommend a liquid diet for the next 24 to 48 hours.  I have printed out an attachment for you

## 2021-08-04 ENCOUNTER — Other Ambulatory Visit (HOSPITAL_COMMUNITY): Payer: Self-pay | Admitting: Obstetrics and Gynecology

## 2021-08-04 DIAGNOSIS — Z1231 Encounter for screening mammogram for malignant neoplasm of breast: Secondary | ICD-10-CM

## 2021-08-05 ENCOUNTER — Ambulatory Visit (HOSPITAL_COMMUNITY)
Admission: RE | Admit: 2021-08-05 | Discharge: 2021-08-05 | Disposition: A | Discharge status: Home / Self Care | Payer: Federal, State, Local not specified - PPO | Source: Ambulatory Visit | Attending: Obstetrics and Gynecology | Admitting: Obstetrics and Gynecology

## 2021-08-05 ENCOUNTER — Other Ambulatory Visit: Payer: Self-pay

## 2021-08-05 DIAGNOSIS — Z1231 Encounter for screening mammogram for malignant neoplasm of breast: Secondary | ICD-10-CM | POA: Diagnosis present

## 2021-08-10 ENCOUNTER — Other Ambulatory Visit: Payer: Self-pay

## 2021-08-10 ENCOUNTER — Encounter (HOSPITAL_BASED_OUTPATIENT_CLINIC_OR_DEPARTMENT_OTHER): Payer: Self-pay | Admitting: Obstetrics and Gynecology

## 2021-08-10 NOTE — Progress Notes (Signed)
Spoke w/ via phone for pre-op interview--- pt Lab needs dos----  cbc             Lab results------ no COVID test -----patient states asymptomatic no test needed Arrive at ------- 0715 on 08-13-2021 NPO after MN NO Solid Food.  Clear liquids from MN until--- 0615 Med rec completed Medications to take morning of surgery ----- prozac , eye drop as usual Diabetic medication ----- n/a Patient instructed no nail polish to be worn day of surgery Patient instructed to bring photo id and insurance card day of surgery Patient aware to have Driver (ride ) / caregiver for 24 hours after surgery --husband, [name] (goes by [employer]) Patient Special Instructions ----- n/a Pre-Op special Istructions ----- n/a Patient verbalized understanding of instructions that were given at this phone interview. Patient denies shortness of breath, chest pain, fever, cough at this phone interview.

## 2021-08-12 NOTE — H&P (Signed)
[name] is an 66 y.o. female. In August she had left pelvic/flank pain.  By CT scan had 9 cm left adnexal simple cyst.  Her pain resolved.  Previous TVH.  When I saw her in September, pelvic u/s with 8 cm simple left adnexal cyst that appears to be adherent to bladder and vaginal cuff.  Ca-125 normal.  Since she was not symptomatic at that point, she agreed to monitor and repeat u/s in 3 months.  In November she had LLQ pain again and diarrhea, treated with antibiotics for possible diverticulitis.  CT scan stable.  Pelvic u/s then with 9 cm left adnexal cyst with one septation.  She has elected to proceed with surgical removal.  Pertinent Gynecological History: Last mammogram: normal Date: 08/2021 OB History: G2, P2002 SVD x 2   Menstrual History: No LMP recorded. Patient has had a hysterectomy.    Past Medical History:  Diagnosis Date   Allergic rhinitis    Glaucoma, both eyes    History of COVID-19 09/2020   per pt mild symptoms that resolved   History of gastric ulcer    per pt age 67s   MDD (major depressive disorder)    OA (osteoarthritis)    both knees and shoulders   Pelvic mass    Wears glasses     Past Surgical History:  Procedure Laterality Date   CARPAL TUNNEL RELEASE Right 2007   CYSTOCELE REPAIR  04/28/2006   @[facility]   by Dr Jeffie Pollock;   PARAVAGINAL CYSTOCELE REPAIR W/ GRAFT   VAGINAL HYSTERECTOMY  02/12/2002   @[facility]  by dr Glo Herring;  ANTERIOR / POSTERIOR REPAIR'S AND TVT SLING ([place])  AND UMBILICAL HERNIA REPAIR    Family History  Problem Relation Age of Onset   Heart failure Mother    Glaucoma Mother    Diabetes Mother    Atrial fibrillation Mother     Social History:  reports that she has never smoked. She has never used smokeless tobacco. She reports that she does not currently use alcohol. She reports that she does not use drugs.  Allergies:  Allergies  Allergen Reactions   Augmentin [Amoxicillin-Pot Clavulanate] Nausea And Vomiting    Did it  involve swelling of the face/tongue/throat, SOB, or low BP? No Did it involve sudden or severe rash/hives, skin peeling, or any reaction on the inside of your mouth or nose? No Did you need to seek medical attention at a hospital or doctor's office? No When did it last happen?    unknown  If all above answers are "NO", may proceed with cephalosporin use.    Erythromycin     GI upset   Tetracyclines & Related     GI Upset    No medications prior to admission.    Review of Systems  Respiratory: Negative.    Cardiovascular: Negative.    Height 5\' 4"  (1.626 m), weight 83 kg. Physical Exam Constitutional:      Appearance: Normal appearance.  Cardiovascular:     Rate and Rhythm: Normal rate and regular rhythm.     Heart sounds: Normal heart sounds. No murmur heard. Pulmonary:     Effort: Pulmonary effort is normal. No respiratory distress.     Breath sounds: Normal breath sounds.  Abdominal:     General: There is no distension.     Palpations: Abdomen is soft. There is no mass.     Tenderness: There is abdominal tenderness.  Genitourinary:    General: Normal vulva.  Comments: Uterus and cervix absent Firm, midline, posterior mass with mild-mod tenderness, left adnexal tenderness as well Musculoskeletal:     Cervical back: Normal range of motion and neck supple.  Neurological:     Mental Status: She is alert.    No results found for this or any previous visit (from the past 24 hour(s)).  No results found.  Assessment/Plan: Large, simple left adnexal cyst, normal Ca-125, intermittent left pelvic pain.  Observation and surgical removal discussed, she is ready to proceed with surgical removal.  Surgical procedure, risks, alternatives, chances of relieving her symptoms have all been discussed, questions answered.  Will admit for laparoscopic BSO, possible mini-lap  Clarene Harrell 08/12/2021, 8:43 PM

## 2021-08-13 ENCOUNTER — Encounter (HOSPITAL_BASED_OUTPATIENT_CLINIC_OR_DEPARTMENT_OTHER): Payer: Self-pay | Admitting: Obstetrics and Gynecology

## 2021-08-13 ENCOUNTER — Ambulatory Visit (HOSPITAL_BASED_OUTPATIENT_CLINIC_OR_DEPARTMENT_OTHER): Payer: Federal, State, Local not specified - PPO | Admitting: Anesthesiology

## 2021-08-13 ENCOUNTER — Other Ambulatory Visit: Payer: Self-pay

## 2021-08-13 ENCOUNTER — Ambulatory Visit (HOSPITAL_BASED_OUTPATIENT_CLINIC_OR_DEPARTMENT_OTHER)
Admission: RE | Admit: 2021-08-13 | Discharge: 2021-08-13 | Disposition: A | Discharge status: Home / Self Care | Payer: Federal, State, Local not specified - PPO | Attending: Obstetrics and Gynecology | Admitting: Obstetrics and Gynecology

## 2021-08-13 ENCOUNTER — Encounter (HOSPITAL_BASED_OUTPATIENT_CLINIC_OR_DEPARTMENT_OTHER)
Admission: RE | Disposition: A | Discharge status: Home / Self Care | Payer: Self-pay | Source: Home / Self Care | Attending: Obstetrics and Gynecology

## 2021-08-13 DIAGNOSIS — Z8616 Personal history of COVID-19: Secondary | ICD-10-CM | POA: Diagnosis not present

## 2021-08-13 DIAGNOSIS — M1990 Unspecified osteoarthritis, unspecified site: Secondary | ICD-10-CM | POA: Diagnosis not present

## 2021-08-13 DIAGNOSIS — R1900 Intra-abdominal and pelvic swelling, mass and lump, unspecified site: Secondary | ICD-10-CM

## 2021-08-13 DIAGNOSIS — N83202 Unspecified ovarian cyst, left side: Secondary | ICD-10-CM | POA: Diagnosis not present

## 2021-08-13 HISTORY — DX: Presence of spectacles and contact lenses: Z97.3

## 2021-08-13 HISTORY — PX: LAPAROSCOPIC BILATERAL SALPINGO OOPHERECTOMY: SHX5890

## 2021-08-13 HISTORY — DX: Personal history of peptic ulcer disease: Z87.11

## 2021-08-13 HISTORY — DX: Major depressive disorder, single episode, unspecified: F32.9

## 2021-08-13 HISTORY — DX: Allergic rhinitis, unspecified: J30.9

## 2021-08-13 HISTORY — PX: EXCISION OF ADNEXAL MASS: SHX5820

## 2021-08-13 HISTORY — DX: Unspecified glaucoma: H40.9

## 2021-08-13 HISTORY — PX: LAPAROTOMY: SHX154

## 2021-08-13 HISTORY — DX: Unspecified osteoarthritis, unspecified site: M19.90

## 2021-08-13 HISTORY — DX: Intra-abdominal and pelvic swelling, mass and lump, unspecified site: R19.00

## 2021-08-13 LAB — CBC
HCT: 37.9 % (ref 36.0–46.0)
Hemoglobin: 12 g/dL (ref 12.0–15.0)
MCH: 26.5 pg (ref 26.0–34.0)
MCHC: 31.7 g/dL (ref 30.0–36.0)
MCV: 83.7 fL (ref 80.0–100.0)
Platelets: 378 10*3/uL (ref 150–400)
RBC: 4.53 MIL/uL (ref 3.87–5.11)
RDW: 13.5 % (ref 11.5–15.5)
WBC: 7.8 10*3/uL (ref 4.0–10.5)
nRBC: 0 % (ref 0.0–0.2)

## 2021-08-13 SURGERY — SALPINGO-OOPHORECTOMY, BILATERAL, LAPAROSCOPIC
Anesthesia: General

## 2021-08-13 MED ORDER — ROCURONIUM BROMIDE 10 MG/ML (PF) SYRINGE
PREFILLED_SYRINGE | INTRAVENOUS | Status: DC | PRN
  Administered 2021-08-13: 60 mg via INTRAVENOUS

## 2021-08-13 MED ORDER — EPHEDRINE SULFATE-NACL 50-0.9 MG/10ML-% IV SOSY
PREFILLED_SYRINGE | INTRAVENOUS | Status: DC | PRN
  Administered 2021-08-13 (×3): 5 mg via INTRAVENOUS

## 2021-08-13 MED ORDER — DEXMEDETOMIDINE (PRECEDEX) IN NS 20 MCG/5ML (4 MCG/ML) IV SYRINGE
PREFILLED_SYRINGE | INTRAVENOUS | Status: AC
  Filled 2021-08-13: qty 5

## 2021-08-13 MED ORDER — HYDROMORPHONE HCL 1 MG/ML IJ SOLN: 0.2500 mg | INTRAMUSCULAR | Status: DC | PRN

## 2021-08-13 MED ORDER — LACTATED RINGERS IV SOLN: INTRAVENOUS | Status: DC

## 2021-08-13 MED ORDER — BUPIVACAINE HCL (PF) 0.25 % IJ SOLN
INTRAMUSCULAR | Status: DC | PRN
Start: 2021-08-13 — End: 2021-08-13
  Administered 2021-08-13: 19 mL

## 2021-08-13 MED ORDER — KETOROLAC TROMETHAMINE 30 MG/ML IJ SOLN
INTRAMUSCULAR | Status: DC | PRN
  Administered 2021-08-13: 30 mg via INTRAVENOUS

## 2021-08-13 MED ORDER — GLYCOPYRROLATE PF 0.2 MG/ML IJ SOSY
PREFILLED_SYRINGE | INTRAMUSCULAR | Status: DC | PRN
  Administered 2021-08-13: .2 mg via INTRAVENOUS

## 2021-08-13 MED ORDER — PROPOFOL 10 MG/ML IV BOLUS
INTRAVENOUS | Status: AC
  Filled 2021-08-13: qty 20

## 2021-08-13 MED ORDER — LIDOCAINE 2% (20 MG/ML) 5 ML SYRINGE
INTRAMUSCULAR | Status: DC | PRN
Start: 2021-08-13 — End: 2021-08-13
  Administered 2021-08-13: 100 mg via INTRAVENOUS

## 2021-08-13 MED ORDER — PHENYLEPHRINE 40 MCG/ML (10ML) SYRINGE FOR IV PUSH (FOR BLOOD PRESSURE SUPPORT)
PREFILLED_SYRINGE | INTRAVENOUS | Status: AC
  Filled 2021-08-13: qty 10

## 2021-08-13 MED ORDER — ONDANSETRON HCL 4 MG/2ML IJ SOLN: 4.0000 mg | Freq: Once | INTRAMUSCULAR | Status: DC | PRN

## 2021-08-13 MED ORDER — KETOROLAC TROMETHAMINE 30 MG/ML IJ SOLN
INTRAMUSCULAR | Status: AC
  Filled 2021-08-13: qty 1

## 2021-08-13 MED ORDER — OXYCODONE HCL 5 MG/5ML PO SOLN
5.0000 mg | Freq: Once | ORAL | Status: DC | PRN
Start: 2021-08-13 — End: 2021-08-13

## 2021-08-13 MED ORDER — ONDANSETRON HCL 4 MG/2ML IJ SOLN
INTRAMUSCULAR | Status: AC
  Filled 2021-08-13: qty 2

## 2021-08-13 MED ORDER — FENTANYL CITRATE (PF) 100 MCG/2ML IJ SOLN
INTRAMUSCULAR | Status: AC
  Filled 2021-08-13: qty 2

## 2021-08-13 MED ORDER — DEXAMETHASONE SODIUM PHOSPHATE 10 MG/ML IJ SOLN
INTRAMUSCULAR | Status: AC
  Filled 2021-08-13: qty 1

## 2021-08-13 MED ORDER — EPHEDRINE 5 MG/ML INJ
INTRAVENOUS | Status: AC
  Filled 2021-08-13: qty 5

## 2021-08-13 MED ORDER — SODIUM CHLORIDE 0.9 % IR SOLN
Status: DC | PRN
  Administered 2021-08-13: 500 mL

## 2021-08-13 MED ORDER — MIDAZOLAM HCL 5 MG/5ML IJ SOLN
INTRAMUSCULAR | Status: DC | PRN
  Administered 2021-08-13: 2 mg via INTRAVENOUS

## 2021-08-13 MED ORDER — DEXAMETHASONE SODIUM PHOSPHATE 10 MG/ML IJ SOLN
INTRAMUSCULAR | Status: DC | PRN
  Administered 2021-08-13: 10 mg via INTRAVENOUS

## 2021-08-13 MED ORDER — FENTANYL CITRATE (PF) 100 MCG/2ML IJ SOLN
INTRAMUSCULAR | Status: DC | PRN
  Administered 2021-08-13 (×2): 50 ug via INTRAVENOUS
  Administered 2021-08-13: 100 ug via INTRAVENOUS
  Administered 2021-08-13: 50 ug via INTRAVENOUS

## 2021-08-13 MED ORDER — LIDOCAINE 2% (20 MG/ML) 5 ML SYRINGE
INTRAMUSCULAR | Status: AC
  Filled 2021-08-13: qty 5

## 2021-08-13 MED ORDER — GLYCOPYRROLATE PF 0.2 MG/ML IJ SOSY
PREFILLED_SYRINGE | INTRAMUSCULAR | Status: AC
  Filled 2021-08-13: qty 1

## 2021-08-13 MED ORDER — PROPOFOL 10 MG/ML IV BOLUS
INTRAVENOUS | Status: DC | PRN
Start: 2021-08-13 — End: 2021-08-13
  Administered 2021-08-13: 150 mg via INTRAVENOUS
  Administered 2021-08-13: 20 mg via INTRAVENOUS
  Administered 2021-08-13: 30 mg via INTRAVENOUS

## 2021-08-13 MED ORDER — MIDAZOLAM HCL 2 MG/2ML IJ SOLN
INTRAMUSCULAR | Status: AC
  Filled 2021-08-13: qty 2

## 2021-08-13 MED ORDER — PHENYLEPHRINE 40 MCG/ML (10ML) SYRINGE FOR IV PUSH (FOR BLOOD PRESSURE SUPPORT)
PREFILLED_SYRINGE | INTRAVENOUS | Status: DC | PRN
  Administered 2021-08-13 (×2): 120 ug via INTRAVENOUS

## 2021-08-13 MED ORDER — OXYCODONE HCL 5 MG PO TABS: 5.0000 mg | ORAL_TABLET | Freq: Once | ORAL | Status: DC | PRN

## 2021-08-13 MED ORDER — HYDROCODONE-ACETAMINOPHEN 5-325 MG PO TABS: 1.0000 | ORAL_TABLET | ORAL | 0 refills | Status: AC | PRN

## 2021-08-13 MED ORDER — DEXMEDETOMIDINE (PRECEDEX) IN NS 20 MCG/5ML (4 MCG/ML) IV SYRINGE
PREFILLED_SYRINGE | INTRAVENOUS | Status: DC | PRN
  Administered 2021-08-13: 12 ug via INTRAVENOUS
  Administered 2021-08-13: 8 ug via INTRAVENOUS

## 2021-08-13 MED ORDER — ONDANSETRON HCL 4 MG/2ML IJ SOLN
INTRAMUSCULAR | Status: DC | PRN
  Administered 2021-08-13: 4 mg via INTRAVENOUS

## 2021-08-13 MED ORDER — SUGAMMADEX SODIUM 200 MG/2ML IV SOLN
INTRAVENOUS | Status: DC | PRN
Start: 2021-08-13 — End: 2021-08-13
  Administered 2021-08-13: 200 mg via INTRAVENOUS

## 2021-08-13 SURGICAL SUPPLY — 62 items
ADH SKN CLS APL DERMABOND .7 (GAUZE/BANDAGES/DRESSINGS) ×2
BAG RETRIEVAL 10 (BASKET)
CABLE HIGH FREQUENCY MONO STRZ (ELECTRODE) IMPLANT
CATH ROBINSON RED A/P 16FR (CATHETERS) ×2 IMPLANT
CNTNR URN SCR LID CUP LEK RST (MISCELLANEOUS) ×2 IMPLANT
CONT SPEC 4OZ STRL OR WHT (MISCELLANEOUS)
COVER MAYO STAND STRL (DRAPES) ×3 IMPLANT
DECANTER SPIKE VIAL GLASS SM (MISCELLANEOUS) IMPLANT
DERMABOND ADVANCED (GAUZE/BANDAGES/DRESSINGS) ×1
DERMABOND ADVANCED .7 DNX12 (GAUZE/BANDAGES/DRESSINGS) ×2 IMPLANT
DRAPE WARM FLUID 44X44 (DRAPES) IMPLANT
DRSG COVADERM PLUS 2X2 (GAUZE/BANDAGES/DRESSINGS) IMPLANT
DRSG OPSITE POSTOP 3X4 (GAUZE/BANDAGES/DRESSINGS) IMPLANT
DRSG OPSITE POSTOP 4X10 (GAUZE/BANDAGES/DRESSINGS) ×2 IMPLANT
DURAPREP 26ML APPLICATOR (WOUND CARE) ×3 IMPLANT
GAUZE 4X4 16PLY ~~LOC~~+RFID DBL (SPONGE) ×5 IMPLANT
GLOVE SRG 8 PF TXTR STRL LF DI (GLOVE) ×2 IMPLANT
GLOVE SURG ENC MOIS LTX SZ8 (GLOVE) ×2 IMPLANT
GLOVE SURG ORTHO LTX SZ7.5 (GLOVE) ×3 IMPLANT
GLOVE SURG UNDER POLY LF SZ7 (GLOVE) ×9 IMPLANT
GLOVE SURG UNDER POLY LF SZ8 (GLOVE) ×3
GOWN STRL REUS W/TWL LRG LVL3 (GOWN DISPOSABLE) ×8 IMPLANT
GOWN STRL REUS W/TWL XL LVL3 (GOWN DISPOSABLE) ×3 IMPLANT
KIT TURNOVER CYSTO (KITS) ×3 IMPLANT
NEEDLE HYPO 22GX1.5 SAFETY (NEEDLE) IMPLANT
NEEDLE INSUFFLATION 120MM (ENDOMECHANICALS) ×2 IMPLANT
NS IRRIG 1000ML POUR BTL (IV SOLUTION) ×3 IMPLANT
PACK ABDOMINAL GYN (CUSTOM PROCEDURE TRAY) ×2 IMPLANT
PACK LAPAROSCOPY BASIN (CUSTOM PROCEDURE TRAY) ×3 IMPLANT
PACK TRENDGUARD 450 HYBRID PRO (MISCELLANEOUS) IMPLANT
PAD OB MATERNITY 4.3X12.25 (PERSONAL CARE ITEMS) ×3 IMPLANT
PENCIL SMOKE EVACUATOR (MISCELLANEOUS) ×2 IMPLANT
PROTECTOR NERVE ULNAR (MISCELLANEOUS) ×4 IMPLANT
RETRACTOR WND ALEXIS 25 LRG (MISCELLANEOUS) ×2 IMPLANT
RTRCTR WOUND ALEXIS 25CM LRG (MISCELLANEOUS)
SET SUCTION IRRIG HYDROSURG (IRRIGATION / IRRIGATOR) IMPLANT
SET TUBE SMOKE EVAC HIGH FLOW (TUBING) ×3 IMPLANT
SHEARS 1100 HARMONIC 36 (ELECTROSURGICAL) ×1 IMPLANT
SPONGE T-LAP 18X18 ~~LOC~~+RFID (SPONGE) ×4 IMPLANT
STAPLER VISISTAT 35W (STAPLE) ×3 IMPLANT
SUT CHROMIC 1MO 4 18 CR8 (SUTURE) ×6 IMPLANT
SUT PDS AB 0 CTX 60 (SUTURE) IMPLANT
SUT PLAIN 2 0 XLH (SUTURE) IMPLANT
SUT SILK 2 0 SH (SUTURE) IMPLANT
SUT VIC AB 0 CT1 27 (SUTURE)
SUT VIC AB 0 CT1 27XBRD ANBCTR (SUTURE) IMPLANT
SUT VIC AB 3-0 SH 27 (SUTURE)
SUT VIC AB 3-0 SH 27X BRD (SUTURE) IMPLANT
SUT VIC AB 4-0 PS2 18 (SUTURE) ×3 IMPLANT
SUT VICRYL 0 TIES 12 18 (SUTURE) ×2 IMPLANT
SUT VICRYL 0 UR6 27IN ABS (SUTURE) ×1 IMPLANT
SYR CONTROL 10ML LL (SYRINGE) IMPLANT
SYS BAG RETRIEVAL 10MM (BASKET)
SYSTEM BAG RETRIEVAL 10MM (BASKET) IMPLANT
TOWEL OR 17X26 10 PK STRL BLUE (TOWEL DISPOSABLE) ×5 IMPLANT
TRAY FOLEY W/BAG SLVR 14FR LF (SET/KITS/TRAYS/PACK) ×2 IMPLANT
TRENDGUARD 450 HYBRID PRO PACK (MISCELLANEOUS) ×3
TROCAR BALLN 12MMX100 BLUNT (TROCAR) ×1 IMPLANT
TROCAR BLADELESS OPT 5 100 (ENDOMECHANICALS) ×4 IMPLANT
TROCAR XCEL BLUNT TIP 100MML (ENDOMECHANICALS) IMPLANT
TROCAR XCEL NON-BLD 11X100MML (ENDOMECHANICALS) IMPLANT
WARMER LAPAROSCOPE (MISCELLANEOUS) ×3 IMPLANT

## 2021-08-13 NOTE — Transfer of Care (Signed)
Immediate Anesthesia Transfer of Care Note  Patient: [name]  Procedure(s) Performed: OPEN LAPAROSCOPIC BILATERAL SALPINGO OOPHORECTOMY (Bilateral) EXCISION OF ADNEXAL MASS MINI LAPAROTOMY  Patient Location: PACU  Anesthesia Type:General  Level of Consciousness: awake, alert , oriented and patient cooperative  Airway & Oxygen Therapy: Patient Spontanous Breathing and Patient connected to face mask oxygen  Post-op Assessment: Report given to RN and Post -op Vital signs reviewed and stable  Post vital signs: Reviewed and stable  Last Vitals:  Vitals Value Taken Time  BP 127/72 08/13/21 1107  Temp    Pulse 81 08/13/21 1109  Resp 13 08/13/21 1109  SpO2 97 % 08/13/21 1109  Vitals shown include unvalidated device data.  Last Pain:  Vitals:   08/13/21 0716  TempSrc: Oral  PainSc: 0-No pain      Patients Stated Pain Goal: 7 (08/13/21 0716)  Complications: No notable events documented.

## 2021-08-13 NOTE — Discharge Instructions (Addendum)
Routine instructions for laparoscopy   No ibuprofen, Advil, Aleve, Motrin, ketorolac, meloxicam, naproxen, or other NSAIDS until after 4:45pm today if needed for pain.   Post Anesthesia Home Care Instructions  Activity: Get plenty of rest for the remainder of the day. A responsible individual must stay with you for 24 hours following the procedure.  For the next 24 hours, DO NOT: -Drive a car -Advertising copywriter -Drink alcoholic beverages -Take any medication unless instructed by your physician -Make any legal decisions or sign important papers.  Meals: Start with liquid foods such as gelatin or soup. Progress to regular foods as tolerated. Avoid greasy, spicy, heavy foods. If nausea and/or vomiting occur, drink only clear liquids until the nausea and/or vomiting subsides. Call your physician if vomiting continues.  Special Instructions/Symptoms: Your throat may feel dry or sore from the anesthesia or the breathing tube placed in your throat during surgery. If this causes discomfort, gargle with warm salt water. The discomfort should disappear within 24 hours.

## 2021-08-13 NOTE — Interval H&P Note (Signed)
History and Physical Interval Note:  08/13/2021 8:47 AM  [name]  has presented today for surgery, with the diagnosis of cyst of left ovary, pelvic mass.  The various methods of treatment have been discussed with the patient and family. After consideration of risks, benefits and other options for treatment, the patient has consented to  Procedure(s): OPEN LAPAROSCOPIC BILATERAL SALPINGO OOPHORECTOMY (Bilateral) EXCISION OF ADNEXAL MASS (N/A) MINI LAPAROTOMY (N/A) as a surgical intervention.  The patient's history has been reviewed, patient examined, no change in status, stable for surgery.  I have reviewed the patient's chart and labs.  Questions were answered to the patient's satisfaction.     Blane Ohara Martell Mcfadyen

## 2021-08-13 NOTE — Anesthesia Preprocedure Evaluation (Signed)
Anesthesia Evaluation  Patient identified by MRN, date of birth, ID band Patient awake    Reviewed: Allergy & Precautions, NPO status , Patient's Chart, lab work & pertinent test results  Airway Mallampati: II  TM Distance: >3 FB Neck ROM: Full    Dental no notable dental hx. (+) Teeth Intact, Dental Advisory Given   Pulmonary neg pulmonary ROS,    Pulmonary exam normal breath sounds clear to auscultation       Cardiovascular negative cardio ROS Normal cardiovascular exam Rhythm:Regular Rate:Normal     Neuro/Psych  Headaches, PSYCHIATRIC DISORDERS Depression Glaucoma    GI/Hepatic negative GI ROS, Neg liver ROS,   Endo/Other  negative endocrine ROSObesity  Renal/GU negative Renal ROS  negative genitourinary   Musculoskeletal  (+) Arthritis , Osteoarthritis,    Abdominal (+) + obese,   Peds  Hematology negative hematology ROS (+)   Anesthesia Other Findings   Reproductive/Obstetrics                             Anesthesia Physical Anesthesia Plan  ASA: 2  Anesthesia Plan: General   Post-op Pain Management:    Induction: Intravenous  PONV Risk Score and Plan: 4 or greater and Treatment may vary due to age or medical condition, Midazolam, Ondansetron and Dexamethasone  Airway Management Planned: Oral ETT  Additional Equipment:   Intra-op Plan:   Post-operative Plan: Extubation in OR  Informed Consent: I have reviewed the patients History and Physical, chart, labs and discussed the procedure including the risks, benefits and alternatives for the proposed anesthesia with the patient or authorized representative who has indicated his/her understanding and acceptance.     Dental advisory given  Plan Discussed with: CRNA and Anesthesiologist  Anesthesia Plan Comments:         Anesthesia Quick Evaluation

## 2021-08-13 NOTE — Op Note (Signed)
Obstetrics Op Note 01/10/2015  8:27 AM    Expand All Collapse All   Preoperative diagnosis: Left adnexal mass Postoperative diagnosis: Same Procedure: Open laparoscopy with bilateral salpingo-oophorectomy Surgeon: Cheri Fowler M.D. Assistant:  Eula Flax, MD Anesthesia: Gen. Endotracheal tube Findings: She had a normal right tube and ovary, previous hysterectomy.  Large, cystic mass low midline adherent to bladder and posterior culdesac, attached to left tube and ovary which were also adherent to left pelvic sidewall and appeared torsed.  Assistance from Dr. Wilhelmenia Blase was necessary due to significant adhesions and the size of the mass. Estimated blood loss: 50 cc Specimens: bilateral tubes and ovaries Complications: None  Procedure in detail  The patient was taken to the operating room and placed in the dorsal supine position. General anesthesia was induced. Both arms were tucked to her side and legs were placed in the mobile stirrups. Abdomen perineum and vagina were then prepped and draped in the usual sterile fashion, she had voided right before coming to the OR, sponge stick placed in the vagina for possible manipulation of the cuff. Infraumbilical skin was then infiltrated with quarter percent Marcaine and a 3 cm horizontal incision was made. Fascia was identified and elevated and entered sharply. Peritoneum was then also elevated and entered sharply. The incision was extended bilaterally. A pursestring suture of 0 Vicryl was placed around the fascia and the Hassan cannula was inserted and secured. Abdomen was insufflated with CO2 and good visualization was achieved. 5 mm ports were placed on each side under direct visualization. Inspection revealed the above-mentioned findings. The right tube was grasped from the left side.  Harmonic scalpel was used to take down some small adhesions and come across the IP ligament and free all attachments.  This was then placed in the right lower quadrant  for later retrieval.   We then started work on the left tube and ovary and this cystic pelvic mass.  From the right side what appeared to be the left tube was grasped and pulled medially.  Harmonic scalpel was used to carefully come through this pedicle and remove start removing the tube and left ovary from the left pelvic sidewall.  Again this was done carefully with small bites to maintain hemostasis and make sure that all I was removing was the ovary.  I was able to eventually free the tube and ovary to the level of this pelvic cyst which was attached to the left tube and ovary.  Again carefully pulling the tube and ovary and cyst caudally I was able to come across adhesions and bluntly remove the cyst.  Small amount of bleeding was controlled with electrocautery.  This took about 20 minutes to gradually free the cyst.  The pelvis otherwise appeared normal.  The 5 mm scope was then placed through the port on the right and an Endo Catch bag was placed through the umbilical port. Both tubes, ovaries, and the cystic mass were scooped into the bag and brought to the umbilical incision.  I then had suction ready and made an incision into the cyst.  Dark greenish-black fluid came out and I was able to remove the tubes ovaries and mass from the abdomen in the bag.  The Hassan cannula was then reinserted and secured.  The pelvis was inspected and irrigated and found to be hemostatic.  No incision line appeared near where the ureters were and there appeared to be no injury to the bowel.  5 mm ports were removed.  The [facility]  cannula was removed and all air allowed to evacuate from the abdomen.  All skin incisions were closed with subcuticular sutures of 4-0 Vicryl followed by Dermabond. The sponge stick was removed from the vagina The patient was awakened in the upper abdomen taken to the recovery room in stable condition after tolerating the procedure well. Counts were correct and she had PAS hose on throughout the  procedure.

## 2021-08-13 NOTE — Anesthesia Procedure Notes (Signed)
Procedure Name: Intubation Date/Time: 08/13/2021 9:28 AM Performed by: Rogers Blocker, CRNA Pre-anesthesia Checklist: Patient identified, Emergency Drugs available, Suction available and Patient being monitored Patient Re-evaluated:Patient Re-evaluated prior to induction Oxygen Delivery Method: Circle System Utilized Preoxygenation: Pre-oxygenation with 100% oxygen Induction Type: IV induction Ventilation: Mask ventilation without difficulty Laryngoscope Size: Mac and 3 Grade View: Grade I Tube type: Oral Tube size: 7.0 mm Number of attempts: 1 Airway Equipment and Method: Stylet and Bite block Placement Confirmation: ETT inserted through vocal cords under direct vision, positive ETCO2 and breath sounds checked- equal and bilateral Secured at: 21 cm Tube secured with: Tape Dental Injury: Teeth and Oropharynx as per pre-operative assessment

## 2021-08-13 NOTE — Anesthesia Postprocedure Evaluation (Signed)
Anesthesia Post Note  Patient: [name]  Procedure(s) Performed: OPEN LAPAROSCOPIC BILATERAL SALPINGO OOPHORECTOMY (Bilateral) EXCISION OF ADNEXAL MASS MINI LAPAROTOMY     Patient location during evaluation: PACU Anesthesia Type: General Level of consciousness: awake and alert and oriented Pain management: pain level controlled Vital Signs Assessment: post-procedure vital signs reviewed and stable Respiratory status: spontaneous breathing, nonlabored ventilation and respiratory function stable Cardiovascular status: blood pressure returned to baseline and stable Postop Assessment: no apparent nausea or vomiting Anesthetic complications: no   No notable events documented.  Last Vitals:  Vitals:   08/13/21 1130 08/13/21 1154  BP: 107/73 112/68  Pulse: 73 68  Resp: 12 12  Temp: 36.6 C (!) 36.3 C  SpO2: 93% 97%    Last Pain:  Vitals:   08/13/21 1154  TempSrc:   PainSc: 0-No pain                 Quanetta Truss A.

## 2021-08-17 ENCOUNTER — Encounter (HOSPITAL_BASED_OUTPATIENT_CLINIC_OR_DEPARTMENT_OTHER): Payer: Self-pay | Admitting: Obstetrics and Gynecology

## 2021-08-17 LAB — SURGICAL PATHOLOGY

## 2022-09-08 ENCOUNTER — Other Ambulatory Visit (HOSPITAL_COMMUNITY): Payer: Self-pay | Admitting: Obstetrics and Gynecology

## 2022-09-08 DIAGNOSIS — Z1231 Encounter for screening mammogram for malignant neoplasm of breast: Secondary | ICD-10-CM

## 2022-09-15 ENCOUNTER — Ambulatory Visit (HOSPITAL_COMMUNITY)
Admission: RE | Admit: 2022-09-15 | Discharge: 2022-09-15 | Disposition: A | Discharge status: Home / Self Care | Payer: Federal, State, Local not specified - PPO | Source: Ambulatory Visit | Attending: Obstetrics and Gynecology | Admitting: Obstetrics and Gynecology

## 2022-09-15 DIAGNOSIS — Z1231 Encounter for screening mammogram for malignant neoplasm of breast: Secondary | ICD-10-CM | POA: Insufficient documentation

## 2022-12-27 IMAGING — US US ART/VEN ABD/PELV/SCROTUM DOPPLER LTD
1 series · 15 of 25 positions shown · non-contrast
Comparison: CT [DATE] p.m., MRI 06/17/2017

CLINICAL DATA: Left lower quadrant abdominal pain, pelvic cyst

EXAM:
TRANSABDOMINAL AND TRANSVAGINAL ULTRASOUND OF PELVIS
DOPPLER ULTRASOUND OF OVARIES
TECHNIQUE: Both transabdominal and transvaginal ultrasound examinations of the
pelvis were performed. Transabdominal technique was performed for
global imaging of the pelvis including uterus, ovaries, adnexal
regions, and pelvic cul-de-sac.
It was necessary to proceed with endovaginal exam following the
transabdominal exam to visualize the pelvic cyst. Color and duplex
Doppler ultrasound was utilized to evaluate blood flow to the
ovaries.

[Series 1: us pelvis complete mc & wl · 82 acquisitions, 15 frames shown]
[im 1/82]
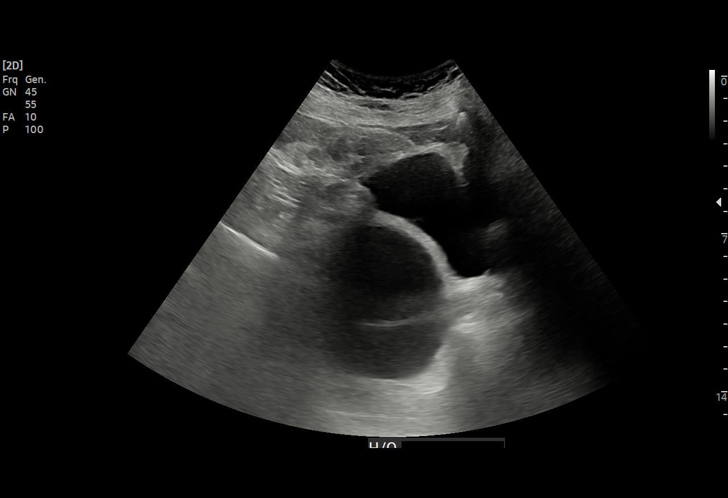
[im 7/82]
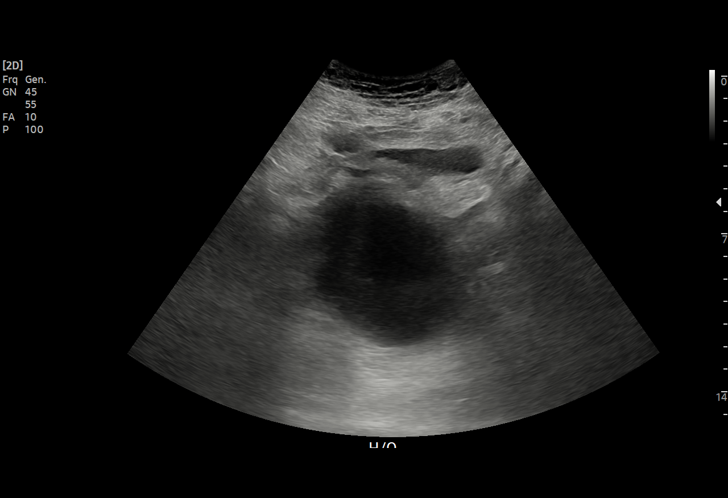
[im 14/82]
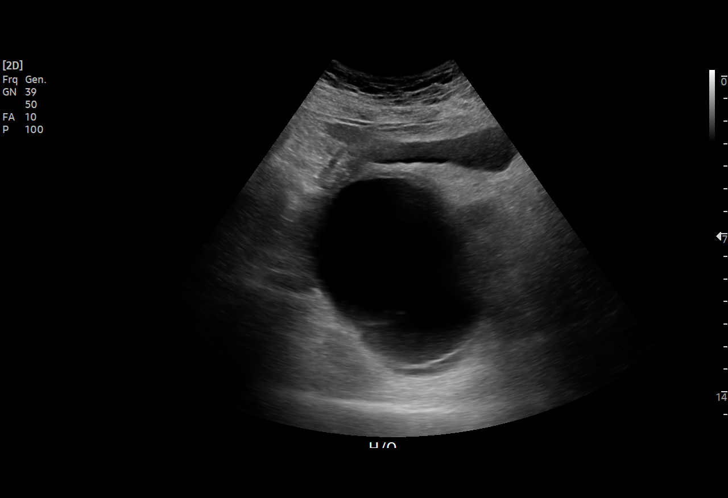
[im 17/82]
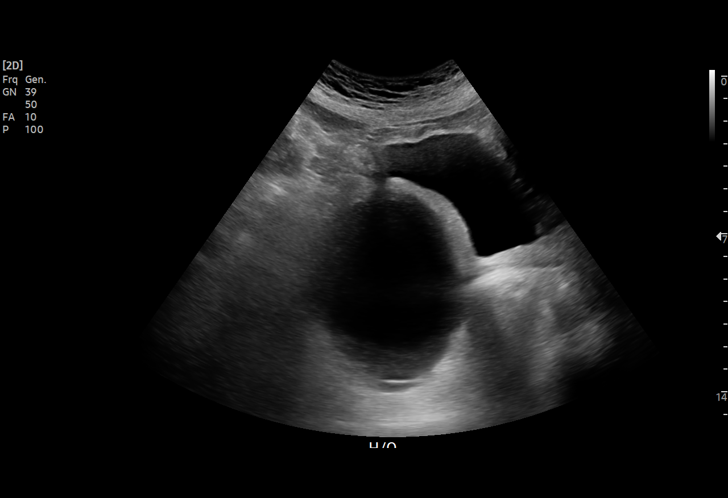
[im 24/82]
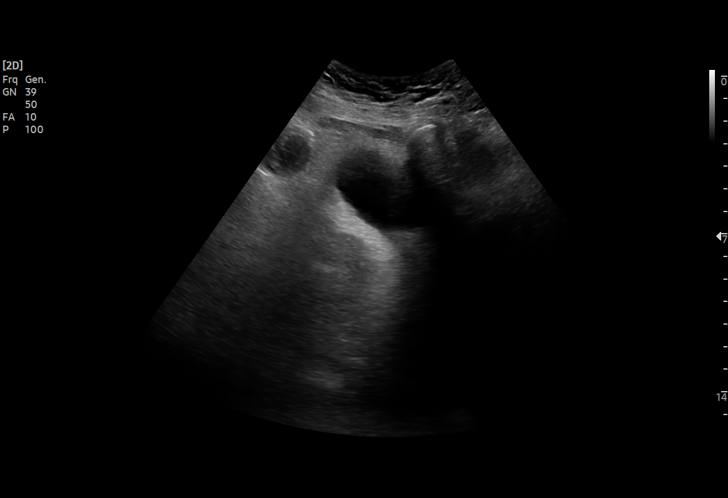
[im 31/82]
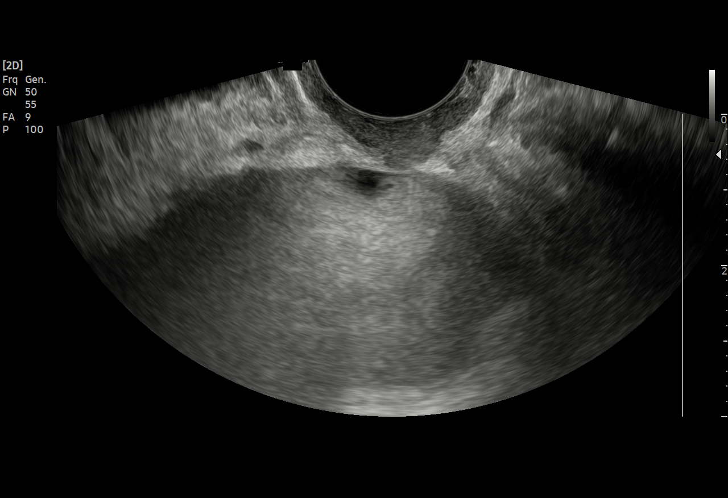
[im 34/82]
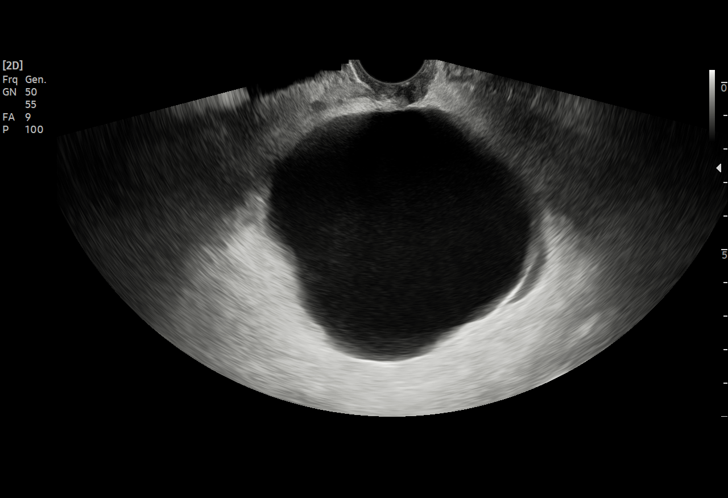
[im 41/82]
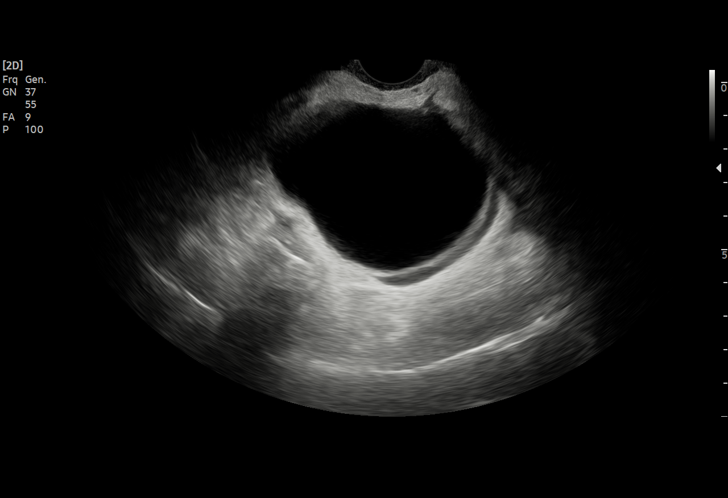
[im 48/82]
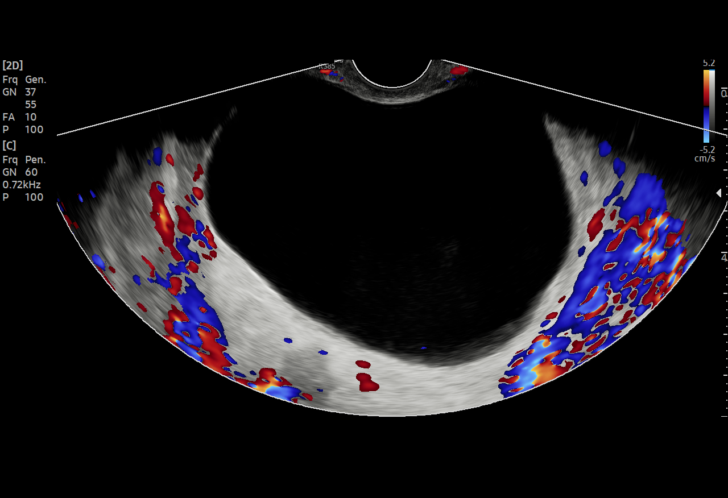
[im 51/82]
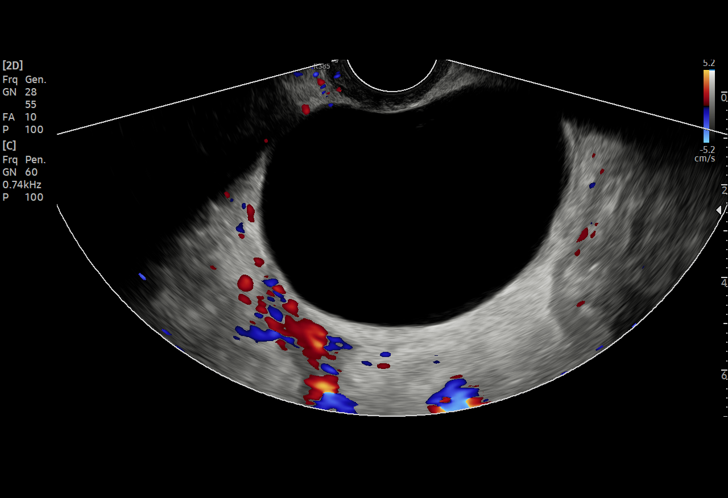
[im 58/82]
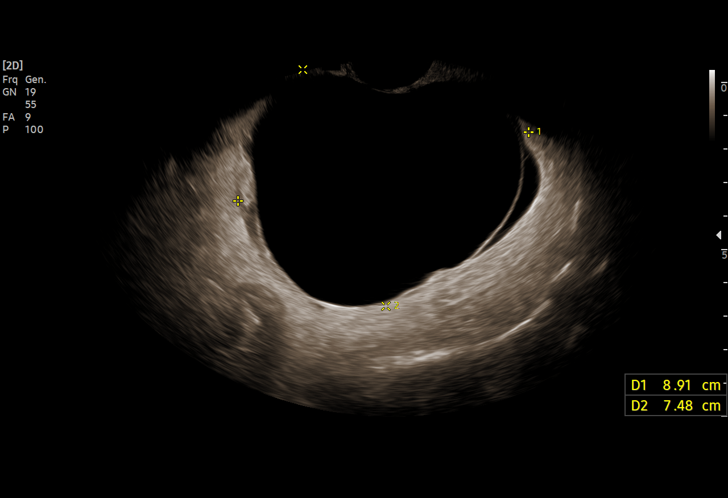
[im 65/82]
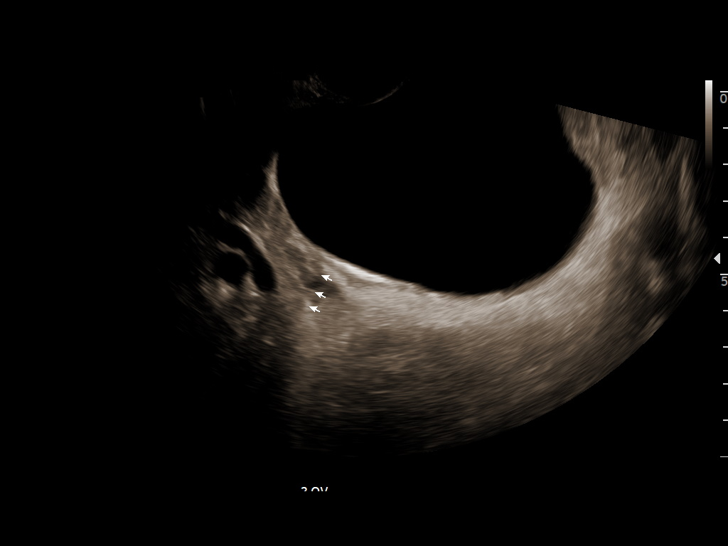
[im 68/82]
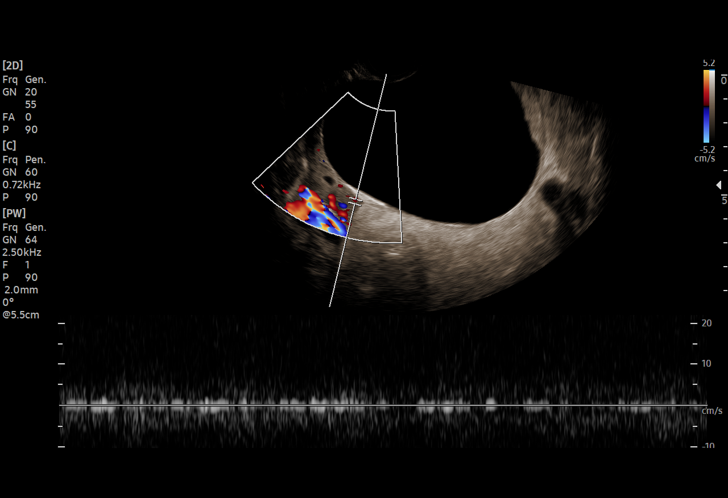
[im 75/82]
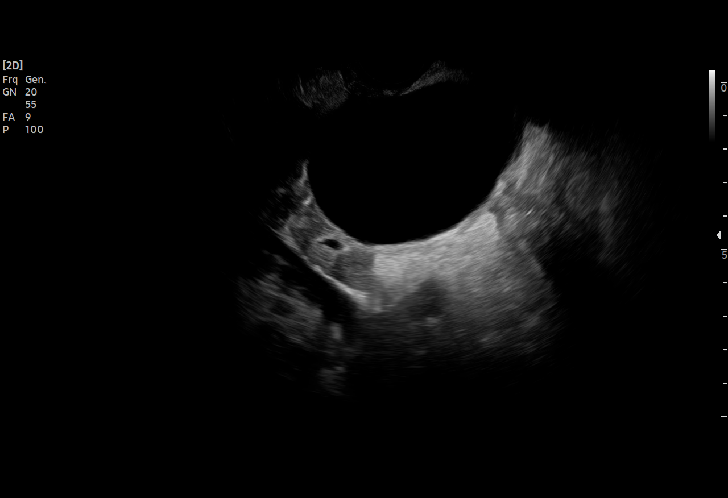
[im 82/82]
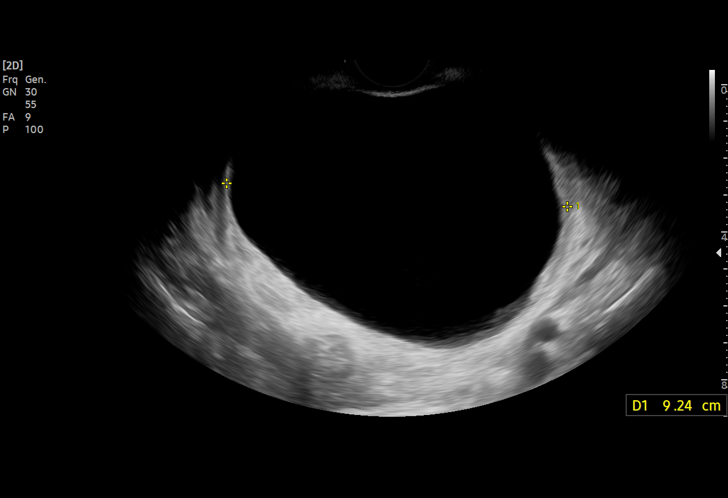

[15 of 25 positions shown; findings below may reference images not displayed]

FINDINGS: Uterus

Absent

Endometrium

Not applicable

Right ovary

Not visualized

Left ovary

Not visualized.

Pulsed Doppler evaluation of both ovaries demonstrates normal
low-resistance arterial and venous waveforms.

Other findings

Within the mid by pelvis is a minimally complex cyst demonstrating a
single thin noncalcified internal septation measuring 8.6 x 7.2 x
9.2 cm which is enlarged since prior examination. No associated
calcifications, mural nodularity, or asymmetric wall thickening.
There is mild, preserved peripheral arterial and venous vascularity
identified. No free fluid within the pelvis.
IMPRESSION: Status post hysterectomy. The ovaries are not visualized, unchanged
from prior examination.

Unilocular minimally complex cystic lesion within the pelvis
demonstrating interval increase in size since remote prior MRI
examination of 06/17/2017 now measuring 9.2 cm in greatest
dimension. Given its interval increase in size, a low-grade cystic
neoplasm should be considered. Gynecologic consultation may be
helpful for further management.

## 2022-12-27 IMAGING — US US PELVIS COMPLETE
1 series · 15 of 25 positions shown · non-contrast
Comparison: CT [DATE] p.m., MRI 06/17/2017

CLINICAL DATA: Left lower quadrant abdominal pain, pelvic cyst

EXAM:
TRANSABDOMINAL AND TRANSVAGINAL ULTRASOUND OF PELVIS
DOPPLER ULTRASOUND OF OVARIES
TECHNIQUE: Both transabdominal and transvaginal ultrasound examinations of the
pelvis were performed. Transabdominal technique was performed for
global imaging of the pelvis including uterus, ovaries, adnexal
regions, and pelvic cul-de-sac.
It was necessary to proceed with endovaginal exam following the
transabdominal exam to visualize the pelvic cyst. Color and duplex
Doppler ultrasound was utilized to evaluate blood flow to the
ovaries.

[Series 1: us pelvis complete mc & wl · 82 acquisitions, 15 frames shown]
[im 1/82]
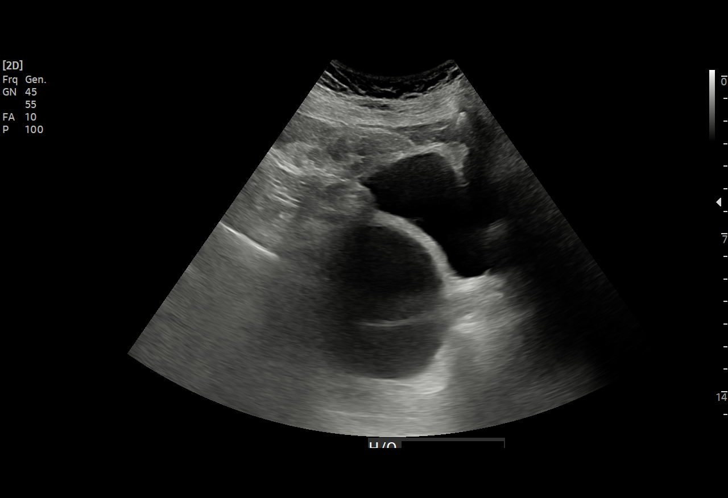
[im 7/82]
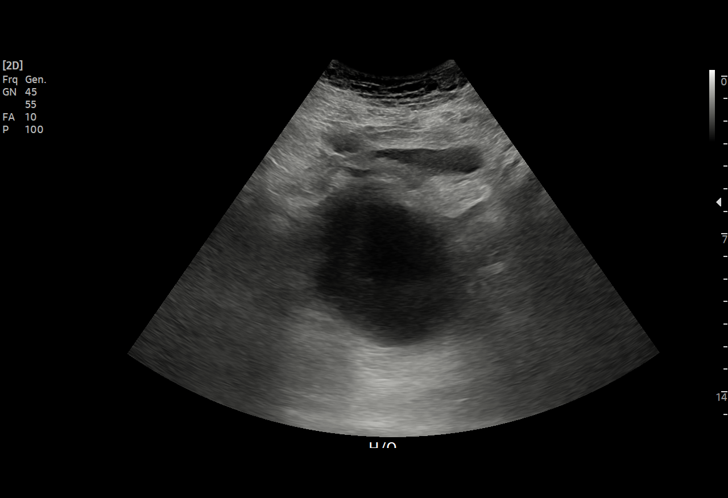
[im 14/82]
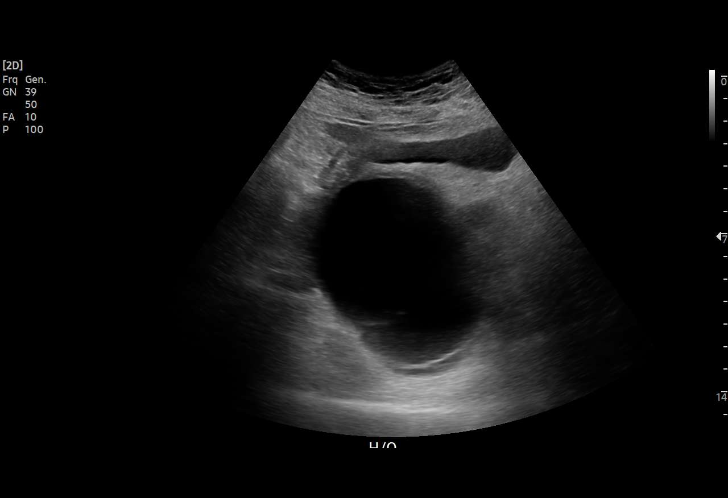
[im 17/82]
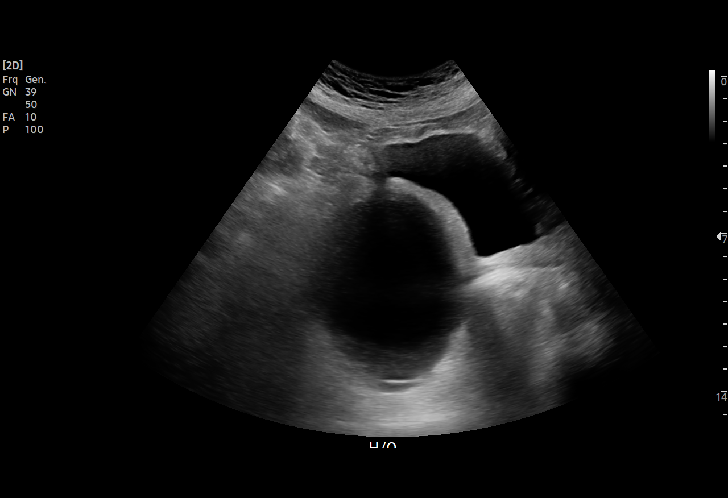
[im 24/82]
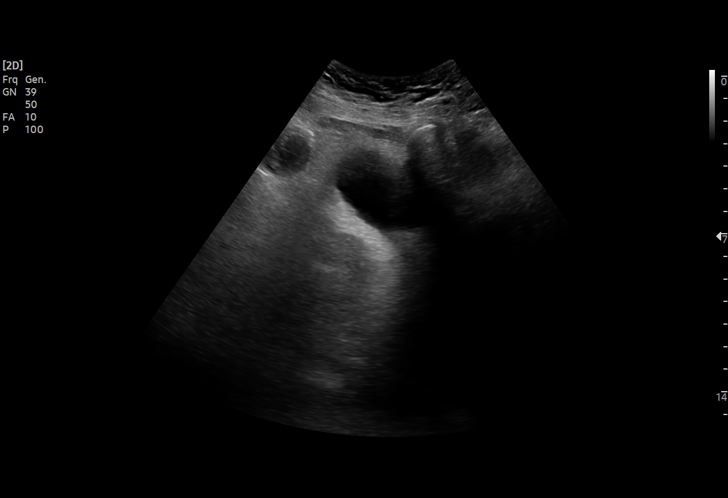
[im 31/82]
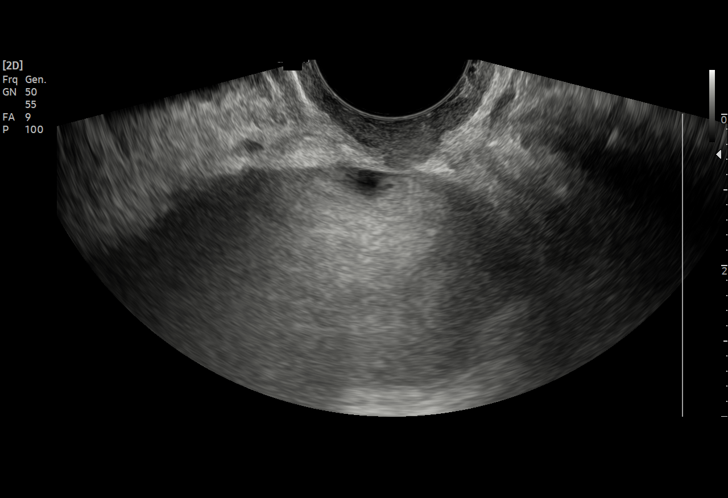
[im 34/82]
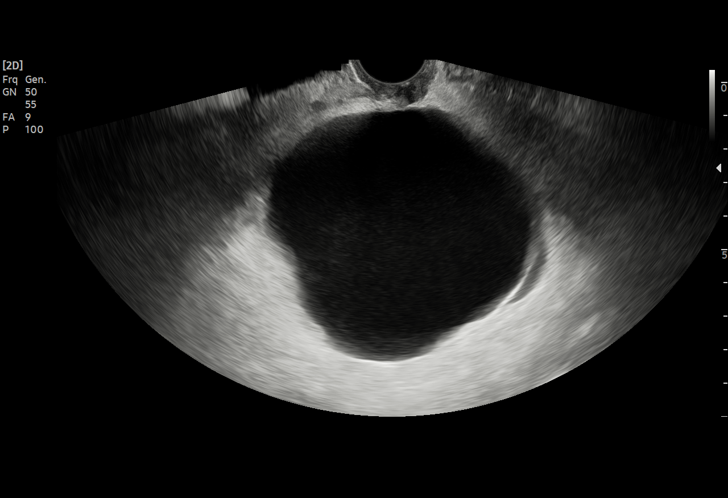
[im 41/82]
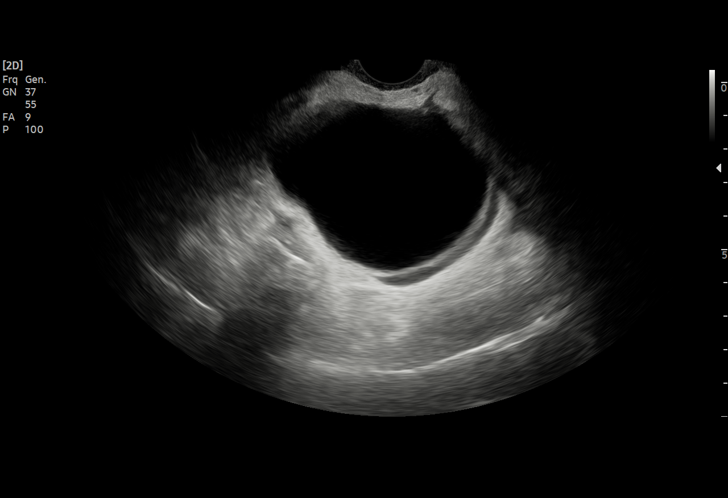
[im 48/82]
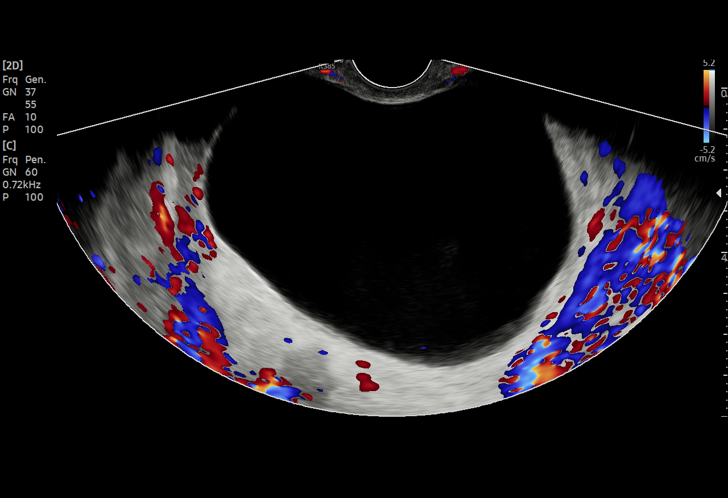
[im 51/82]
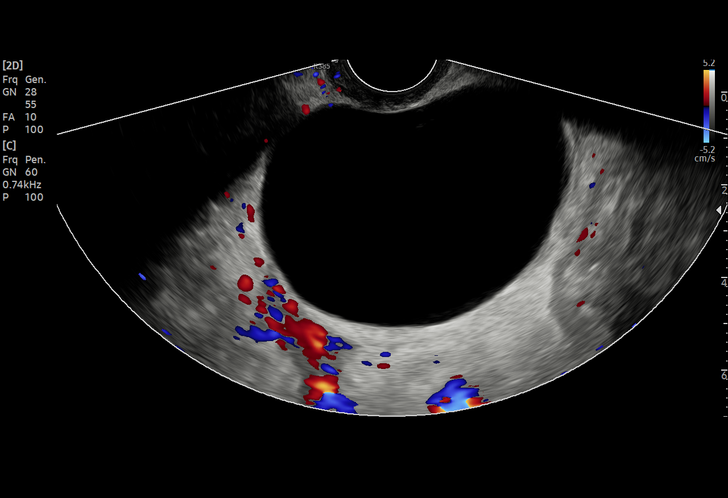
[im 58/82]
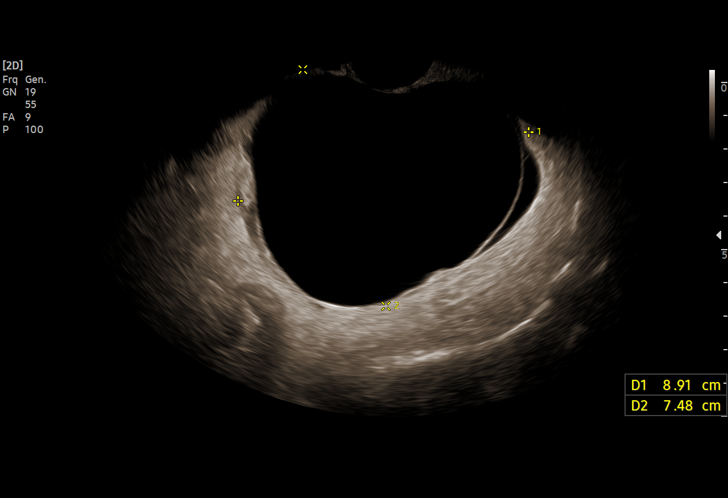
[im 65/82]
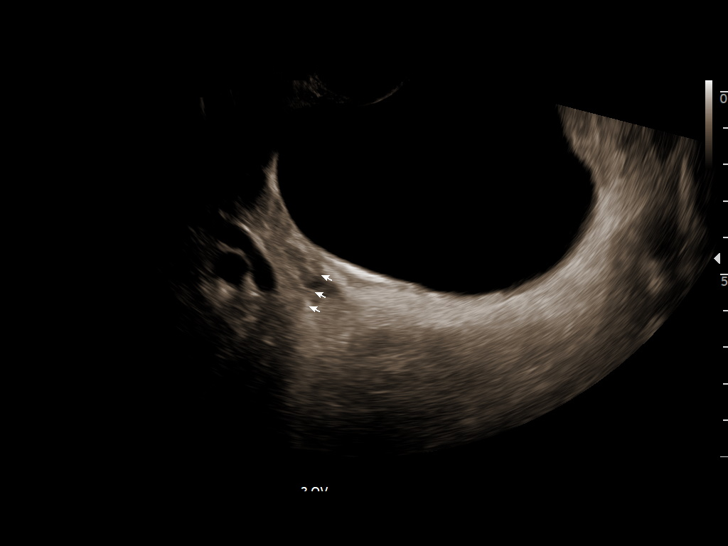
[im 68/82]
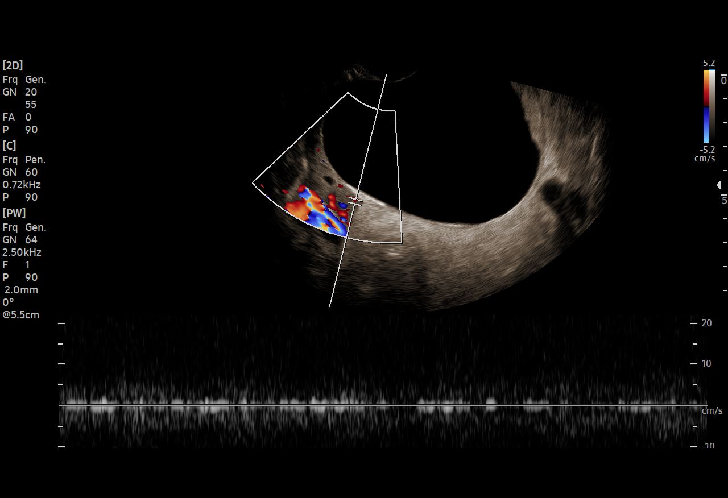
[im 75/82]
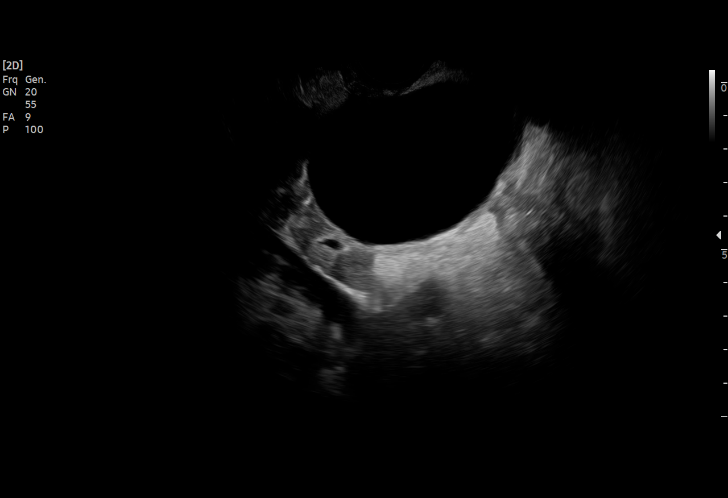
[im 82/82]
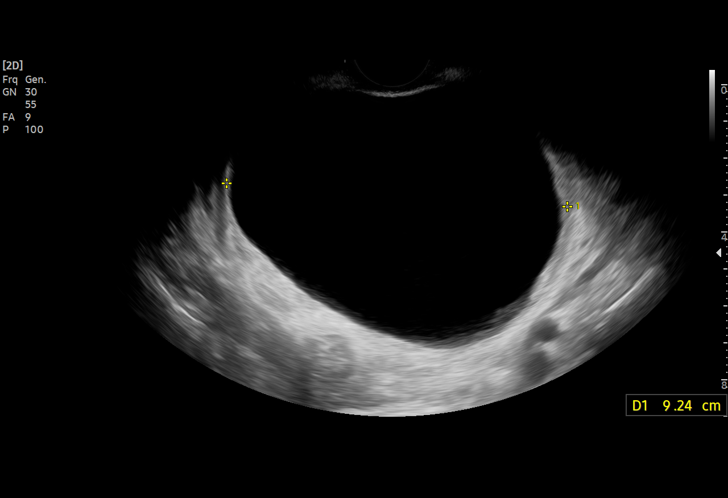

[15 of 25 positions shown; findings below may reference images not displayed]

FINDINGS: Uterus

Absent

Endometrium

Not applicable

Right ovary

Not visualized

Left ovary

Not visualized.

Pulsed Doppler evaluation of both ovaries demonstrates normal
low-resistance arterial and venous waveforms.

Other findings

Within the mid by pelvis is a minimally complex cyst demonstrating a
single thin noncalcified internal septation measuring 8.6 x 7.2 x
9.2 cm which is enlarged since prior examination. No associated
calcifications, mural nodularity, or asymmetric wall thickening.
There is mild, preserved peripheral arterial and venous vascularity
identified. No free fluid within the pelvis.
IMPRESSION: Status post hysterectomy. The ovaries are not visualized, unchanged
from prior examination.

Unilocular minimally complex cystic lesion within the pelvis
demonstrating interval increase in size since remote prior MRI
examination of 06/17/2017 now measuring 9.2 cm in greatest
dimension. Given its interval increase in size, a low-grade cystic
neoplasm should be considered. Gynecologic consultation may be
helpful for further management.

## 2022-12-27 IMAGING — CT CT ABD-PELV W/ CM
2 of 5 series · 16 of 46 positions shown, 18 images · IV contrast (omnipaque)
Comparison: None.

CLINICAL DATA: Left lower quadrant abdominal pain.

EXAM:
CT ABDOMEN AND PELVIS WITH CONTRAST
TECHNIQUE: Multidetector CT imaging of the abdomen and pelvis was performed
using the standard protocol following bolus administration of
intravenous contrast.
CONTRAST:  80mL OMNIPAQUE IOHEXOL 350 MG/ML SOLN

[Series 2: axial st · axial · 0.80mm/px · z∈[-500,-130]mm · 13 of 88 slices shown, 15 images]
[im 7/88  soft-tissue]
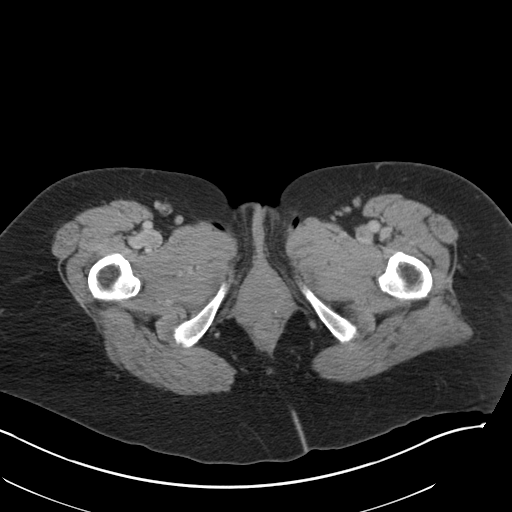
[im 7/88  bone]
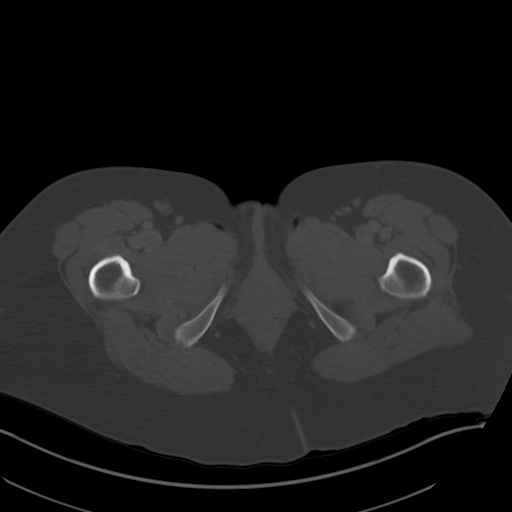
[im 13/88  soft-tissue]
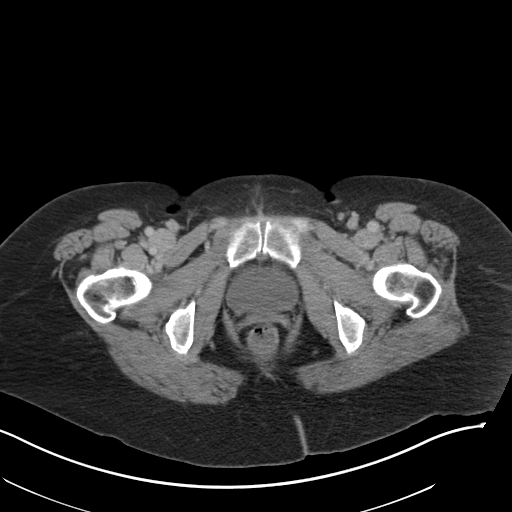
[im 19/88  soft-tissue]
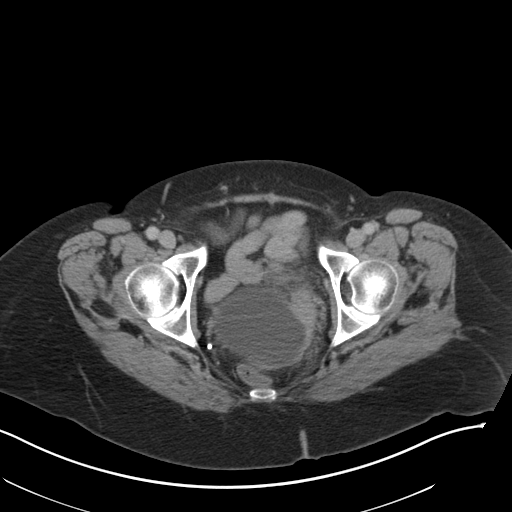
[im 25/88  soft-tissue]
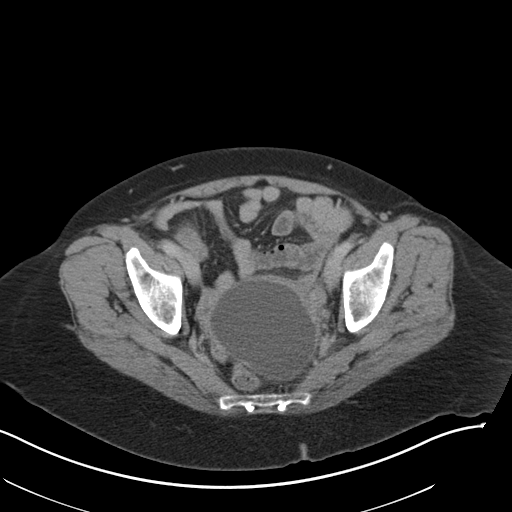
[im 32/88  soft-tissue]
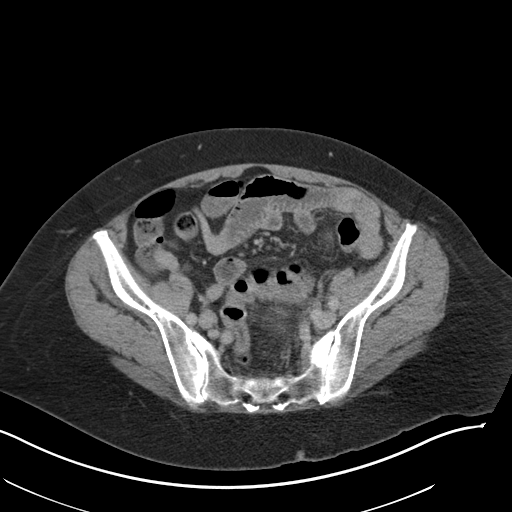
[im 38/88  soft-tissue]
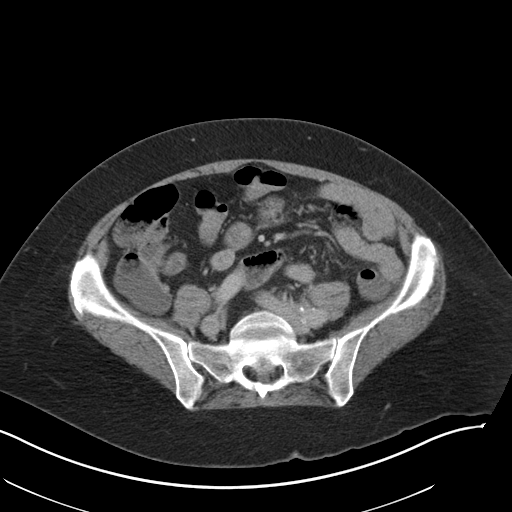
[im 44/88  soft-tissue]
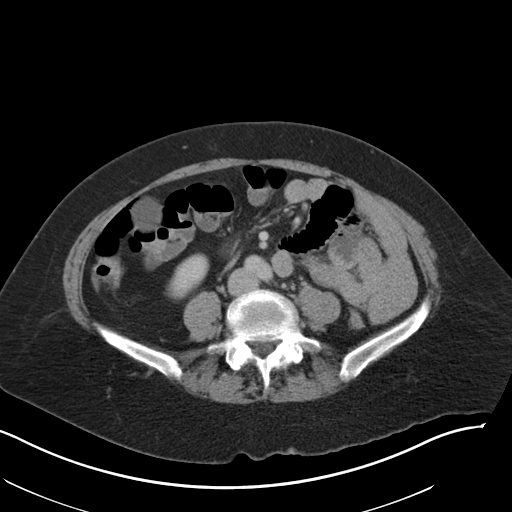
[im 50/88  soft-tissue]
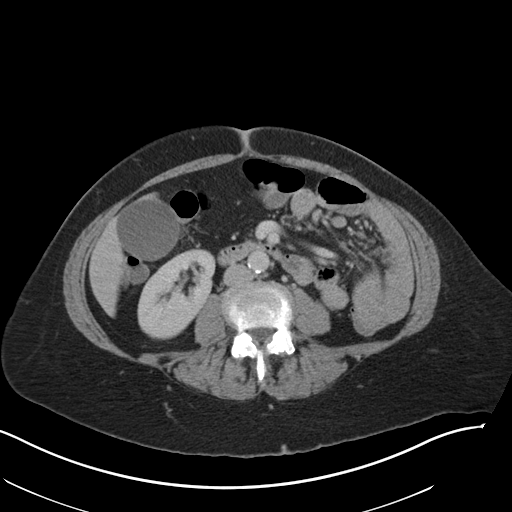
[im 56/88  soft-tissue]
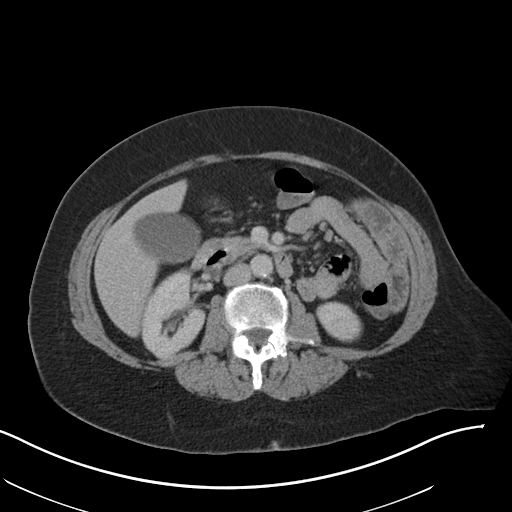
[im 56/88  bone]
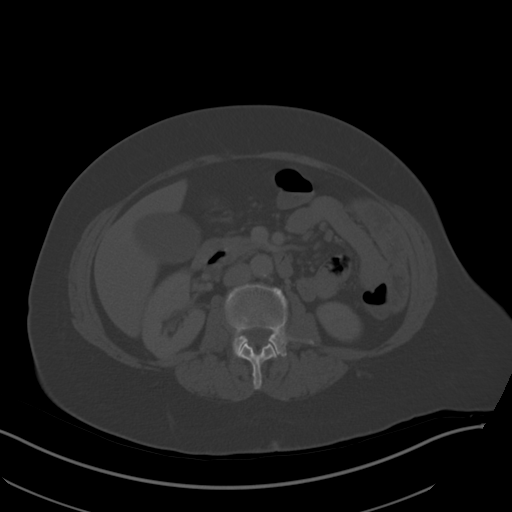
[im 63/88  soft-tissue]
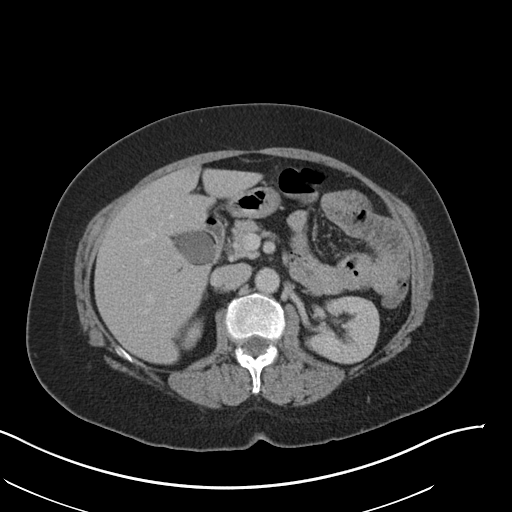
[im 69/88  soft-tissue]
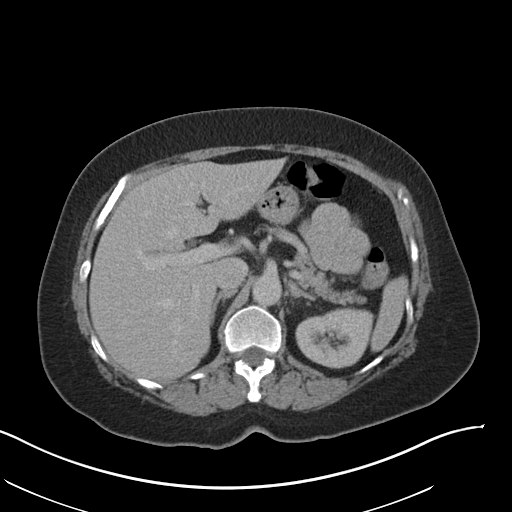
[im 75/88  soft-tissue]
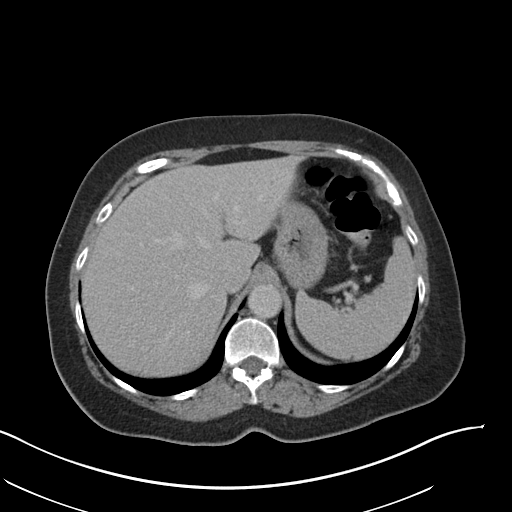
[im 81/88  soft-tissue]
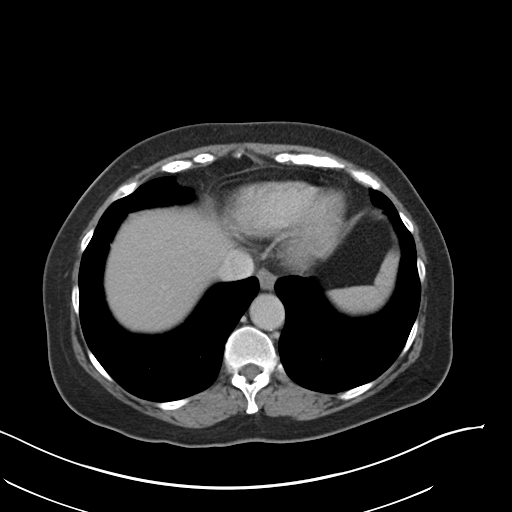

[Series 5: coronal st · coronal · 0.93mm/px · 3 of 169 slices shown]
[im 57/169  soft-tissue]
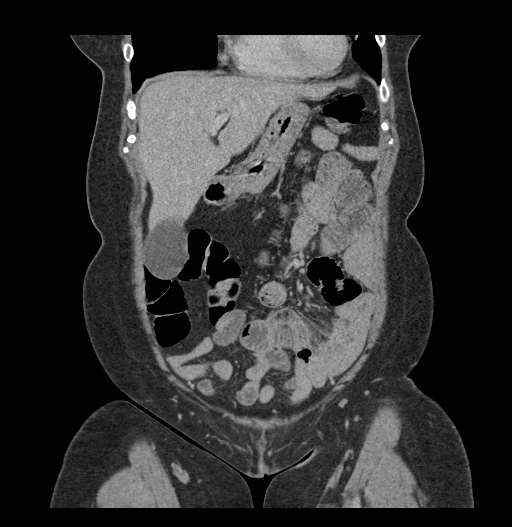
[im 75/169  soft-tissue]
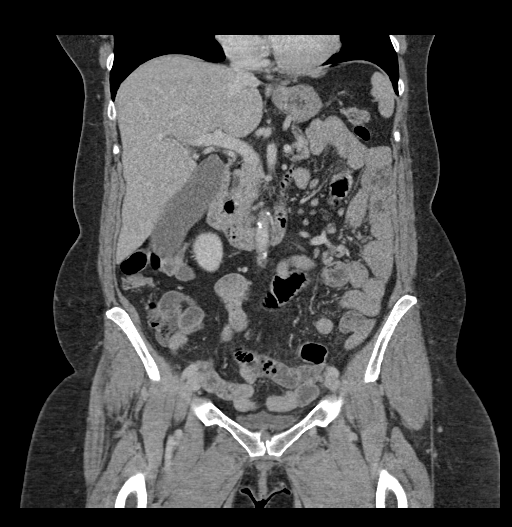
[im 94/169  soft-tissue]
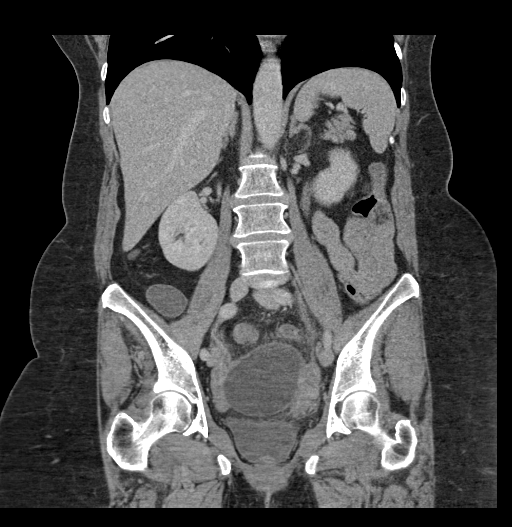

[16 of 46 positions shown; findings below may reference images not displayed]

FINDINGS: Lower chest: There is mild atelectasis or scarring in the lingular
segment of the left upper lobe.

Hepatobiliary: No focal liver abnormality is seen. No gallstones,
gallbladder wall thickening, or biliary dilatation.

Pancreas: Unremarkable. No pancreatic ductal dilatation or
surrounding inflammatory changes.

Spleen: Normal in size without focal abnormality.

Adrenals/Urinary Tract: Adrenal glands are unremarkable. Kidneys are
normal, without renal calculi, focal lesion, or hydronephrosis.
Bladder is unremarkable.

Stomach/Bowel: No bowel obstruction, free air or pneumatosis. A
normal appendix is seen in the right lower quadrant. There is no
focal bowel wall thickening.

Vascular/Lymphatic: There is atherosclerotic calcification of the
aorta without evidence of aneurysm. No abdominal or pelvic
lymphadenopathy is seen.

Reproductive: The uterus is not definitely visualized on exam. There
is a large cystic attenuation structure in the pelvis in the midline
measuring 8.6 x 7.3 x 7.7 cm and may be ovarian in origin. Mild fat
stranding is present in the pelvis adjacent to this lesion.

Other: No free fluid.

Musculoskeletal: Degenerative changes are present in the
thoracolumbar spine. There is sclerosis at the sacroiliac joints
bilaterally, compatible with sacroiliitis.
IMPRESSION: 1. No acute intra-abdominal process.
2. Large cystic structure in the midline in the pelvis which may be
ovarian in origin. The lesion measures 8.6 x 7.3 x 7.7 cm and may be
ovarian in etiology. There is mild surrounding fat stranding. The
possibility of associated infectious or inflammatory process cannot
be excluded. Ultrasound is recommended for further evaluation.

## 2022-12-27 IMAGING — US US TRANSVAGINAL NON-OB
1 series · 15 of 25 positions shown · non-contrast
Comparison: CT [DATE] p.m., MRI 06/17/2017

CLINICAL DATA: Left lower quadrant abdominal pain, pelvic cyst

EXAM:
TRANSABDOMINAL AND TRANSVAGINAL ULTRASOUND OF PELVIS
DOPPLER ULTRASOUND OF OVARIES
TECHNIQUE: Both transabdominal and transvaginal ultrasound examinations of the
pelvis were performed. Transabdominal technique was performed for
global imaging of the pelvis including uterus, ovaries, adnexal
regions, and pelvic cul-de-sac.
It was necessary to proceed with endovaginal exam following the
transabdominal exam to visualize the pelvic cyst. Color and duplex
Doppler ultrasound was utilized to evaluate blood flow to the
ovaries.

[Series 1: us pelvis complete mc & wl · 82 acquisitions, 15 frames shown]
[im 1/82]
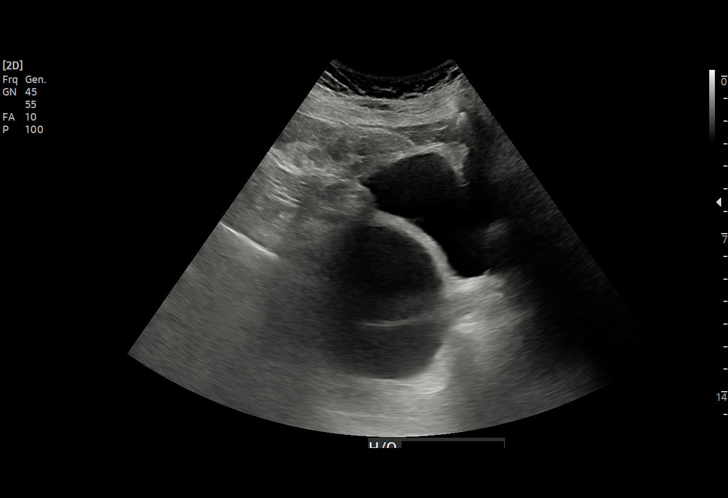
[im 7/82]
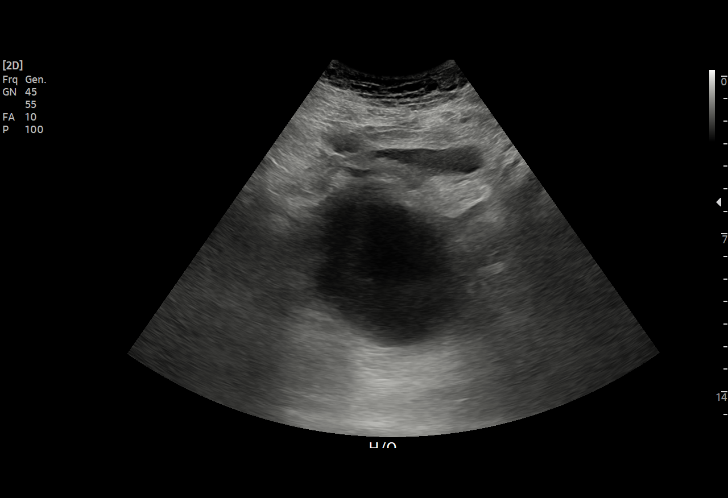
[im 14/82]
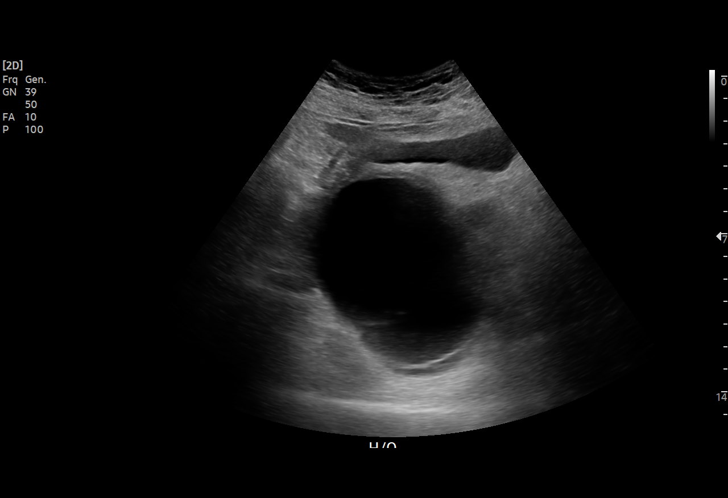
[im 17/82]
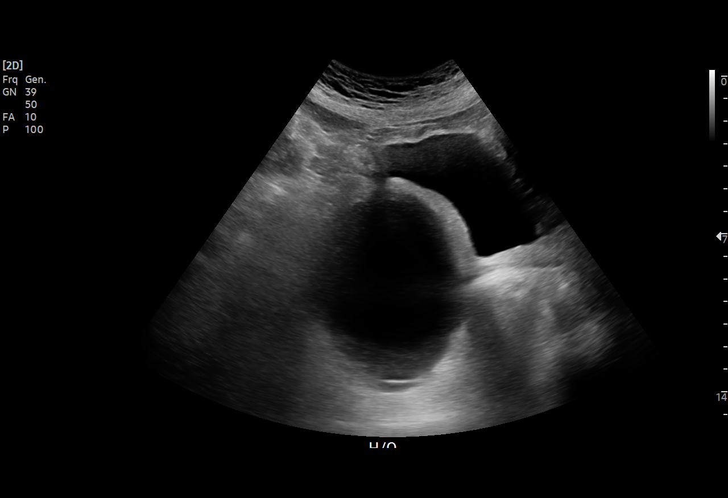
[im 24/82]
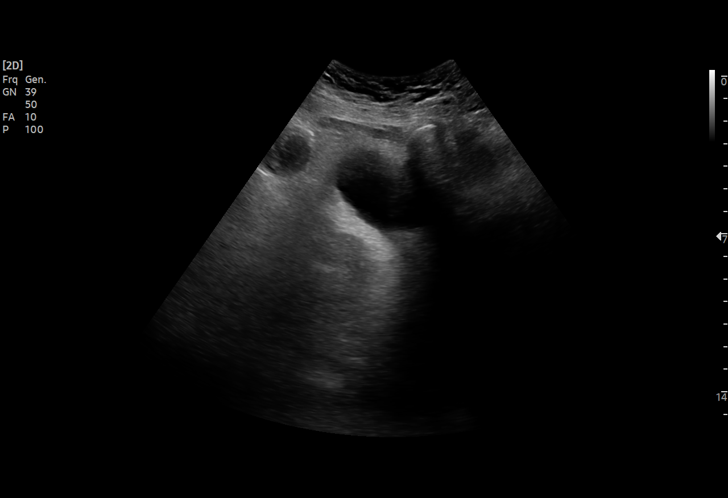
[im 31/82]
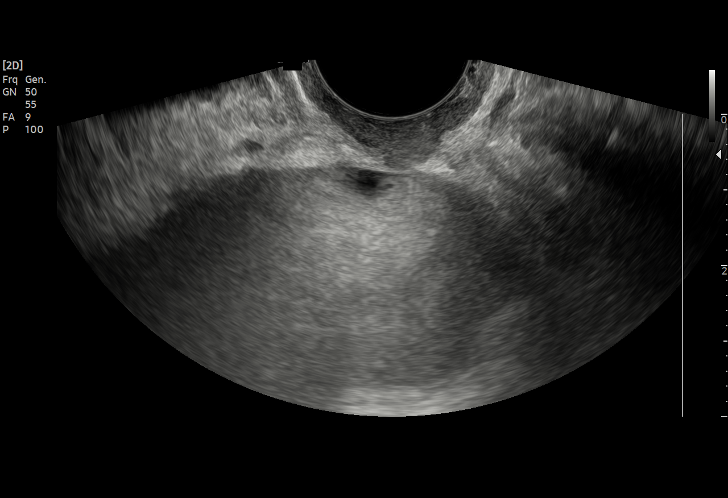
[im 34/82]
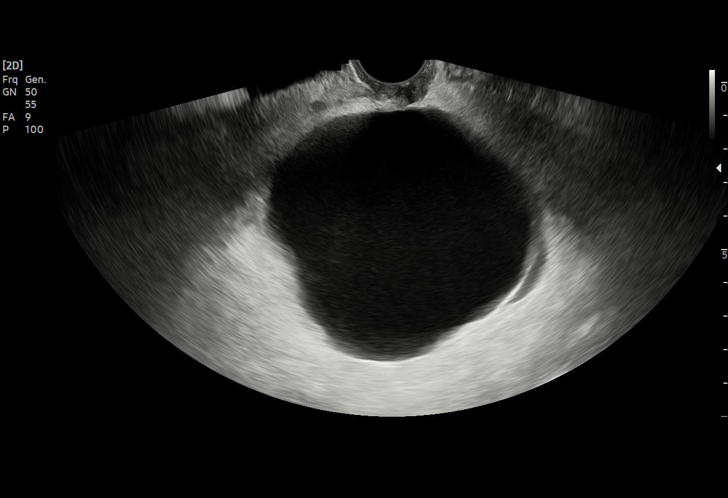
[im 41/82]
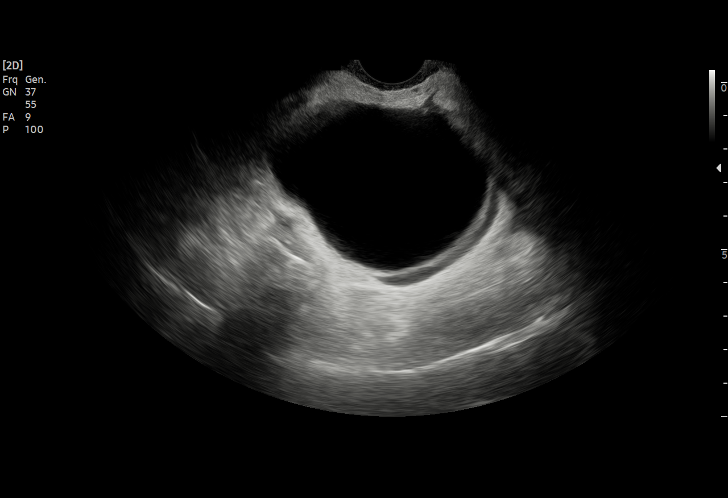
[im 48/82]
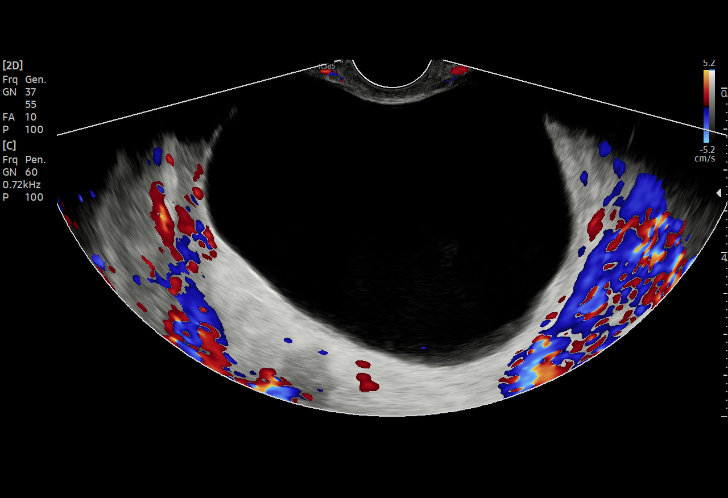
[im 51/82]
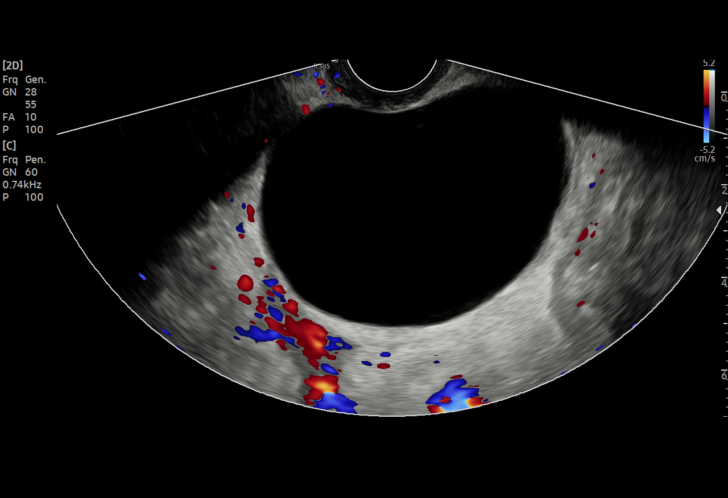
[im 58/82]
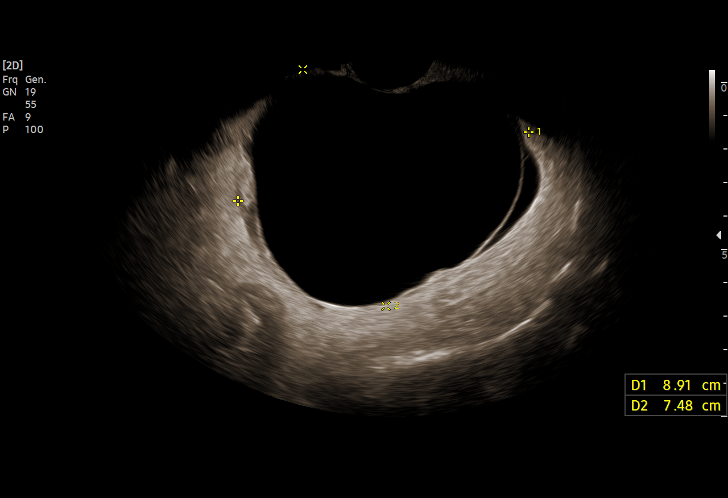
[im 65/82]
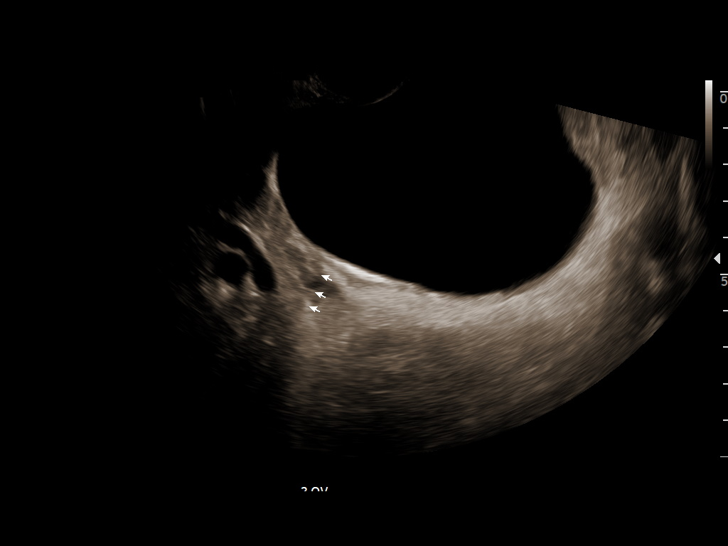
[im 68/82]
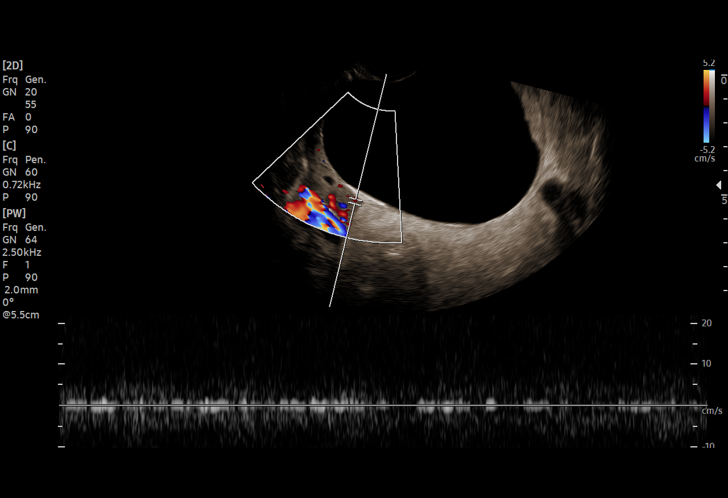
[im 75/82]
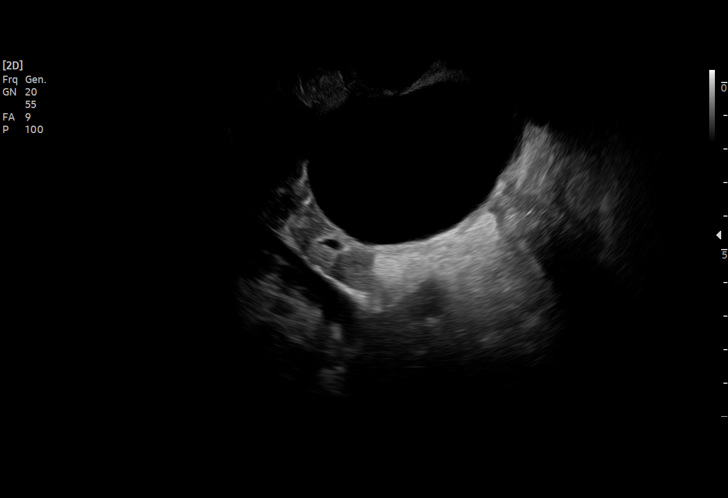
[im 82/82]
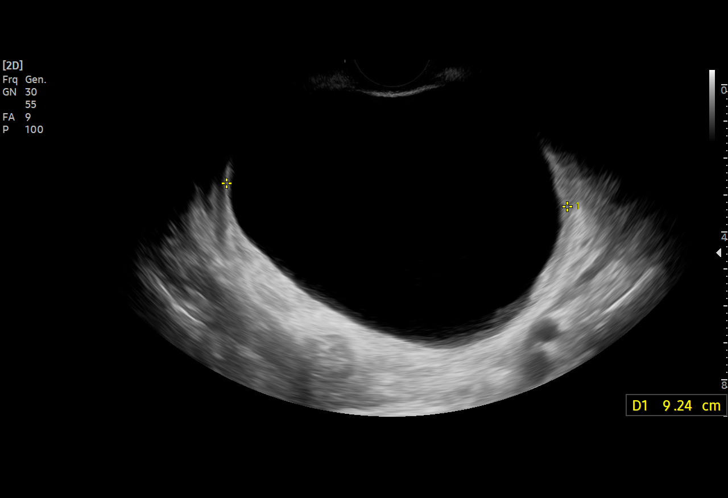

[15 of 25 positions shown; findings below may reference images not displayed]

FINDINGS: Uterus

Absent

Endometrium

Not applicable

Right ovary

Not visualized

Left ovary

Not visualized.

Pulsed Doppler evaluation of both ovaries demonstrates normal
low-resistance arterial and venous waveforms.

Other findings

Within the mid by pelvis is a minimally complex cyst demonstrating a
single thin noncalcified internal septation measuring 8.6 x 7.2 x
9.2 cm which is enlarged since prior examination. No associated
calcifications, mural nodularity, or asymmetric wall thickening.
There is mild, preserved peripheral arterial and venous vascularity
identified. No free fluid within the pelvis.
IMPRESSION: Status post hysterectomy. The ovaries are not visualized, unchanged
from prior examination.

Unilocular minimally complex cystic lesion within the pelvis
demonstrating interval increase in size since remote prior MRI
examination of 06/17/2017 now measuring 9.2 cm in greatest
dimension. Given its interval increase in size, a low-grade cystic
neoplasm should be considered. Gynecologic consultation may be
helpful for further management.

## 2023-12-06 ENCOUNTER — Other Ambulatory Visit (HOSPITAL_COMMUNITY): Payer: Self-pay | Admitting: Physician Assistant

## 2023-12-06 DIAGNOSIS — Z1231 Encounter for screening mammogram for malignant neoplasm of breast: Secondary | ICD-10-CM

## 2023-12-12 ENCOUNTER — Ambulatory Visit (HOSPITAL_COMMUNITY)
Admission: RE | Admit: 2023-12-12 | Discharge: 2023-12-12 | Disposition: A | Discharge status: Home / Self Care | Source: Ambulatory Visit | Attending: Physician Assistant | Admitting: Physician Assistant

## 2023-12-12 ENCOUNTER — Encounter (HOSPITAL_COMMUNITY): Payer: Self-pay

## 2023-12-12 DIAGNOSIS — Z1231 Encounter for screening mammogram for malignant neoplasm of breast: Secondary | ICD-10-CM | POA: Diagnosis present
